# Patient Record
Sex: Female | Born: 1945 | Hispanic: No | Marital: Single | Smoking: Never smoker
Health system: Southern US, Community
[De-identification: ages and names within clinical notes are randomized; demographics above are authoritative.]

## PROBLEM LIST (undated history)

## (undated) DIAGNOSIS — I10 Essential (primary) hypertension: Secondary | ICD-10-CM

## (undated) HISTORY — PX: REPLACEMENT TOTAL KNEE: SUR1224

---

## 2010-11-05 ENCOUNTER — Ambulatory Visit (HOSPITAL_BASED_OUTPATIENT_CLINIC_OR_DEPARTMENT_OTHER): Admission: RE | Admit: 2010-11-05 | Payer: Self-pay | Source: Home / Self Care | Admitting: Orthopedic Surgery

## 2015-09-25 DIAGNOSIS — N3946 Mixed incontinence: Secondary | ICD-10-CM | POA: Diagnosis not present

## 2015-10-02 DIAGNOSIS — N3941 Urge incontinence: Secondary | ICD-10-CM | POA: Diagnosis not present

## 2015-10-09 DIAGNOSIS — R32 Unspecified urinary incontinence: Secondary | ICD-10-CM | POA: Diagnosis not present

## 2015-10-16 DIAGNOSIS — R32 Unspecified urinary incontinence: Secondary | ICD-10-CM | POA: Diagnosis not present

## 2015-10-23 DIAGNOSIS — R32 Unspecified urinary incontinence: Secondary | ICD-10-CM | POA: Diagnosis not present

## 2015-12-09 DIAGNOSIS — E039 Hypothyroidism, unspecified: Secondary | ICD-10-CM | POA: Diagnosis not present

## 2015-12-09 DIAGNOSIS — Z298 Encounter for other specified prophylactic measures: Secondary | ICD-10-CM | POA: Diagnosis not present

## 2015-12-09 DIAGNOSIS — R1319 Other dysphagia: Secondary | ICD-10-CM | POA: Diagnosis not present

## 2015-12-09 DIAGNOSIS — R32 Unspecified urinary incontinence: Secondary | ICD-10-CM | POA: Diagnosis not present

## 2015-12-09 DIAGNOSIS — I10 Essential (primary) hypertension: Secondary | ICD-10-CM | POA: Diagnosis not present

## 2016-01-14 DIAGNOSIS — R599 Enlarged lymph nodes, unspecified: Secondary | ICD-10-CM | POA: Diagnosis not present

## 2016-02-26 DIAGNOSIS — K219 Gastro-esophageal reflux disease without esophagitis: Secondary | ICD-10-CM | POA: Diagnosis not present

## 2016-02-26 DIAGNOSIS — K210 Gastro-esophageal reflux disease with esophagitis: Secondary | ICD-10-CM | POA: Diagnosis not present

## 2016-02-26 DIAGNOSIS — R1310 Dysphagia, unspecified: Secondary | ICD-10-CM | POA: Diagnosis not present

## 2016-02-26 DIAGNOSIS — R1319 Other dysphagia: Secondary | ICD-10-CM | POA: Diagnosis not present

## 2016-03-06 DIAGNOSIS — H5213 Myopia, bilateral: Secondary | ICD-10-CM | POA: Diagnosis not present

## 2016-03-06 DIAGNOSIS — H26491 Other secondary cataract, right eye: Secondary | ICD-10-CM | POA: Diagnosis not present

## 2016-03-06 DIAGNOSIS — R1314 Dysphagia, pharyngoesophageal phase: Secondary | ICD-10-CM | POA: Diagnosis not present

## 2016-03-20 DIAGNOSIS — K209 Esophagitis, unspecified: Secondary | ICD-10-CM | POA: Diagnosis not present

## 2016-03-20 DIAGNOSIS — R1310 Dysphagia, unspecified: Secondary | ICD-10-CM | POA: Diagnosis not present

## 2016-03-20 DIAGNOSIS — K2970 Gastritis, unspecified, without bleeding: Secondary | ICD-10-CM | POA: Diagnosis not present

## 2016-03-20 DIAGNOSIS — K317 Polyp of stomach and duodenum: Secondary | ICD-10-CM | POA: Diagnosis not present

## 2016-03-20 DIAGNOSIS — Z0000 Encounter for general adult medical examination without abnormal findings: Secondary | ICD-10-CM | POA: Diagnosis not present

## 2016-04-07 DIAGNOSIS — R05 Cough: Secondary | ICD-10-CM | POA: Diagnosis not present

## 2016-04-07 DIAGNOSIS — R32 Unspecified urinary incontinence: Secondary | ICD-10-CM | POA: Diagnosis not present

## 2016-04-07 DIAGNOSIS — N3000 Acute cystitis without hematuria: Secondary | ICD-10-CM | POA: Diagnosis not present

## 2016-04-07 DIAGNOSIS — R350 Frequency of micturition: Secondary | ICD-10-CM | POA: Diagnosis not present

## 2016-05-01 DIAGNOSIS — R350 Frequency of micturition: Secondary | ICD-10-CM | POA: Diagnosis not present

## 2016-05-01 DIAGNOSIS — R32 Unspecified urinary incontinence: Secondary | ICD-10-CM | POA: Diagnosis not present

## 2016-05-01 DIAGNOSIS — N3941 Urge incontinence: Secondary | ICD-10-CM | POA: Diagnosis not present

## 2016-05-19 DIAGNOSIS — N3000 Acute cystitis without hematuria: Secondary | ICD-10-CM | POA: Diagnosis not present

## 2016-05-19 DIAGNOSIS — R296 Repeated falls: Secondary | ICD-10-CM | POA: Diagnosis not present

## 2016-05-19 DIAGNOSIS — R413 Other amnesia: Secondary | ICD-10-CM | POA: Diagnosis not present

## 2016-05-26 DIAGNOSIS — Z1231 Encounter for screening mammogram for malignant neoplasm of breast: Secondary | ICD-10-CM | POA: Diagnosis not present

## 2016-06-18 DIAGNOSIS — Z23 Encounter for immunization: Secondary | ICD-10-CM | POA: Diagnosis not present

## 2016-06-18 DIAGNOSIS — N3000 Acute cystitis without hematuria: Secondary | ICD-10-CM | POA: Diagnosis not present

## 2016-06-24 DIAGNOSIS — N3941 Urge incontinence: Secondary | ICD-10-CM | POA: Diagnosis not present

## 2016-06-29 DIAGNOSIS — N3941 Urge incontinence: Secondary | ICD-10-CM | POA: Diagnosis not present

## 2016-06-29 DIAGNOSIS — Z87440 Personal history of urinary (tract) infections: Secondary | ICD-10-CM | POA: Diagnosis not present

## 2016-06-29 DIAGNOSIS — R350 Frequency of micturition: Secondary | ICD-10-CM | POA: Diagnosis not present

## 2016-09-01 DIAGNOSIS — N3941 Urge incontinence: Secondary | ICD-10-CM | POA: Diagnosis not present

## 2016-09-01 DIAGNOSIS — R350 Frequency of micturition: Secondary | ICD-10-CM | POA: Diagnosis not present

## 2016-09-01 DIAGNOSIS — R8290 Unspecified abnormal findings in urine: Secondary | ICD-10-CM | POA: Diagnosis not present

## 2016-09-01 DIAGNOSIS — Z87440 Personal history of urinary (tract) infections: Secondary | ICD-10-CM | POA: Diagnosis not present

## 2016-09-16 DIAGNOSIS — N3941 Urge incontinence: Secondary | ICD-10-CM | POA: Diagnosis not present

## 2016-09-16 DIAGNOSIS — Z87440 Personal history of urinary (tract) infections: Secondary | ICD-10-CM | POA: Diagnosis not present

## 2016-09-29 DIAGNOSIS — N3941 Urge incontinence: Secondary | ICD-10-CM | POA: Diagnosis not present

## 2016-10-16 DIAGNOSIS — Z87440 Personal history of urinary (tract) infections: Secondary | ICD-10-CM | POA: Diagnosis not present

## 2016-10-16 DIAGNOSIS — N3941 Urge incontinence: Secondary | ICD-10-CM | POA: Diagnosis not present

## 2016-10-16 DIAGNOSIS — R8290 Unspecified abnormal findings in urine: Secondary | ICD-10-CM | POA: Diagnosis not present

## 2016-10-27 DIAGNOSIS — Z87440 Personal history of urinary (tract) infections: Secondary | ICD-10-CM | POA: Diagnosis not present

## 2016-10-27 DIAGNOSIS — N3941 Urge incontinence: Secondary | ICD-10-CM | POA: Diagnosis not present

## 2016-11-25 DIAGNOSIS — Z87440 Personal history of urinary (tract) infections: Secondary | ICD-10-CM | POA: Diagnosis not present

## 2016-11-25 DIAGNOSIS — N3941 Urge incontinence: Secondary | ICD-10-CM | POA: Diagnosis not present

## 2016-12-18 DIAGNOSIS — I10 Essential (primary) hypertension: Secondary | ICD-10-CM | POA: Diagnosis not present

## 2016-12-18 DIAGNOSIS — K209 Esophagitis, unspecified: Secondary | ICD-10-CM | POA: Diagnosis not present

## 2016-12-18 DIAGNOSIS — R1319 Other dysphagia: Secondary | ICD-10-CM | POA: Diagnosis not present

## 2016-12-18 DIAGNOSIS — E039 Hypothyroidism, unspecified: Secondary | ICD-10-CM | POA: Diagnosis not present

## 2016-12-18 DIAGNOSIS — Z0000 Encounter for general adult medical examination without abnormal findings: Secondary | ICD-10-CM | POA: Diagnosis not present

## 2016-12-23 DIAGNOSIS — N3941 Urge incontinence: Secondary | ICD-10-CM | POA: Diagnosis not present

## 2016-12-23 DIAGNOSIS — Z87440 Personal history of urinary (tract) infections: Secondary | ICD-10-CM | POA: Diagnosis not present

## 2017-01-06 DIAGNOSIS — N3941 Urge incontinence: Secondary | ICD-10-CM | POA: Diagnosis not present

## 2017-01-06 DIAGNOSIS — R2232 Localized swelling, mass and lump, left upper limb: Secondary | ICD-10-CM | POA: Diagnosis not present

## 2017-01-06 DIAGNOSIS — Z87440 Personal history of urinary (tract) infections: Secondary | ICD-10-CM | POA: Diagnosis not present

## 2017-02-03 DIAGNOSIS — N3941 Urge incontinence: Secondary | ICD-10-CM | POA: Diagnosis not present

## 2017-02-03 DIAGNOSIS — Z87440 Personal history of urinary (tract) infections: Secondary | ICD-10-CM | POA: Diagnosis not present

## 2017-03-23 DIAGNOSIS — Z87440 Personal history of urinary (tract) infections: Secondary | ICD-10-CM | POA: Diagnosis not present

## 2017-03-23 DIAGNOSIS — N3941 Urge incontinence: Secondary | ICD-10-CM | POA: Diagnosis not present

## 2017-05-03 DIAGNOSIS — R1314 Dysphagia, pharyngoesophageal phase: Secondary | ICD-10-CM | POA: Diagnosis not present

## 2017-05-03 DIAGNOSIS — K209 Esophagitis, unspecified: Secondary | ICD-10-CM | POA: Diagnosis not present

## 2017-05-13 DIAGNOSIS — K449 Diaphragmatic hernia without obstruction or gangrene: Secondary | ICD-10-CM | POA: Diagnosis not present

## 2017-05-13 DIAGNOSIS — K317 Polyp of stomach and duodenum: Secondary | ICD-10-CM | POA: Diagnosis not present

## 2017-05-13 DIAGNOSIS — R1310 Dysphagia, unspecified: Secondary | ICD-10-CM | POA: Diagnosis not present

## 2017-05-13 DIAGNOSIS — K222 Esophageal obstruction: Secondary | ICD-10-CM | POA: Diagnosis not present

## 2017-05-26 DIAGNOSIS — K209 Esophagitis, unspecified: Secondary | ICD-10-CM | POA: Diagnosis not present

## 2017-05-26 DIAGNOSIS — R1319 Other dysphagia: Secondary | ICD-10-CM | POA: Diagnosis not present

## 2017-06-11 DIAGNOSIS — Z23 Encounter for immunization: Secondary | ICD-10-CM | POA: Diagnosis not present

## 2017-06-22 DIAGNOSIS — Z1231 Encounter for screening mammogram for malignant neoplasm of breast: Secondary | ICD-10-CM | POA: Diagnosis not present

## 2017-08-03 DIAGNOSIS — R1310 Dysphagia, unspecified: Secondary | ICD-10-CM | POA: Diagnosis not present

## 2017-11-26 DIAGNOSIS — K118 Other diseases of salivary glands: Secondary | ICD-10-CM | POA: Diagnosis not present

## 2018-01-07 DIAGNOSIS — M545 Low back pain: Secondary | ICD-10-CM | POA: Diagnosis not present

## 2018-01-07 DIAGNOSIS — R350 Frequency of micturition: Secondary | ICD-10-CM | POA: Diagnosis not present

## 2018-01-07 DIAGNOSIS — R413 Other amnesia: Secondary | ICD-10-CM | POA: Diagnosis not present

## 2018-01-07 DIAGNOSIS — I10 Essential (primary) hypertension: Secondary | ICD-10-CM | POA: Diagnosis not present

## 2018-01-07 DIAGNOSIS — R296 Repeated falls: Secondary | ICD-10-CM | POA: Diagnosis not present

## 2018-01-07 DIAGNOSIS — Z1159 Encounter for screening for other viral diseases: Secondary | ICD-10-CM | POA: Diagnosis not present

## 2018-01-07 DIAGNOSIS — K921 Melena: Secondary | ICD-10-CM | POA: Diagnosis not present

## 2018-01-07 DIAGNOSIS — Z23 Encounter for immunization: Secondary | ICD-10-CM | POA: Diagnosis not present

## 2018-01-07 DIAGNOSIS — E039 Hypothyroidism, unspecified: Secondary | ICD-10-CM | POA: Diagnosis not present

## 2018-01-07 DIAGNOSIS — Z0000 Encounter for general adult medical examination without abnormal findings: Secondary | ICD-10-CM | POA: Diagnosis not present

## 2018-01-19 DIAGNOSIS — Z780 Asymptomatic menopausal state: Secondary | ICD-10-CM | POA: Diagnosis not present

## 2018-01-19 DIAGNOSIS — M810 Age-related osteoporosis without current pathological fracture: Secondary | ICD-10-CM | POA: Diagnosis not present

## 2018-01-19 DIAGNOSIS — M8589 Other specified disorders of bone density and structure, multiple sites: Secondary | ICD-10-CM | POA: Diagnosis not present

## 2018-01-26 DIAGNOSIS — N3941 Urge incontinence: Secondary | ICD-10-CM | POA: Diagnosis not present

## 2018-01-26 DIAGNOSIS — Z87440 Personal history of urinary (tract) infections: Secondary | ICD-10-CM | POA: Diagnosis not present

## 2018-01-26 DIAGNOSIS — R8290 Unspecified abnormal findings in urine: Secondary | ICD-10-CM | POA: Diagnosis not present

## 2018-02-02 DIAGNOSIS — M7061 Trochanteric bursitis, right hip: Secondary | ICD-10-CM | POA: Diagnosis not present

## 2018-02-02 DIAGNOSIS — R29898 Other symptoms and signs involving the musculoskeletal system: Secondary | ICD-10-CM | POA: Diagnosis not present

## 2018-02-02 DIAGNOSIS — M25561 Pain in right knee: Secondary | ICD-10-CM | POA: Diagnosis not present

## 2018-02-02 DIAGNOSIS — M545 Low back pain: Secondary | ICD-10-CM | POA: Diagnosis not present

## 2018-02-03 DIAGNOSIS — M1711 Unilateral primary osteoarthritis, right knee: Secondary | ICD-10-CM | POA: Diagnosis not present

## 2018-02-17 DIAGNOSIS — N390 Urinary tract infection, site not specified: Secondary | ICD-10-CM | POA: Diagnosis not present

## 2018-02-17 DIAGNOSIS — R8290 Unspecified abnormal findings in urine: Secondary | ICD-10-CM | POA: Diagnosis not present

## 2018-02-17 DIAGNOSIS — R300 Dysuria: Secondary | ICD-10-CM | POA: Diagnosis not present

## 2018-03-02 DIAGNOSIS — S20411A Abrasion of right back wall of thorax, initial encounter: Secondary | ICD-10-CM | POA: Diagnosis not present

## 2018-03-02 DIAGNOSIS — B078 Other viral warts: Secondary | ICD-10-CM | POA: Diagnosis not present

## 2018-03-07 DIAGNOSIS — S52571A Other intraarticular fracture of lower end of right radius, initial encounter for closed fracture: Secondary | ICD-10-CM | POA: Diagnosis not present

## 2018-03-07 DIAGNOSIS — S62024A Nondisplaced fracture of middle third of navicular [scaphoid] bone of right wrist, initial encounter for closed fracture: Secondary | ICD-10-CM | POA: Diagnosis not present

## 2018-03-07 DIAGNOSIS — S6991XA Unspecified injury of right wrist, hand and finger(s), initial encounter: Secondary | ICD-10-CM | POA: Diagnosis not present

## 2018-03-15 DIAGNOSIS — S62024A Nondisplaced fracture of middle third of navicular [scaphoid] bone of right wrist, initial encounter for closed fracture: Secondary | ICD-10-CM | POA: Diagnosis not present

## 2018-03-22 DIAGNOSIS — S62024D Nondisplaced fracture of middle third of navicular [scaphoid] bone of right wrist, subsequent encounter for fracture with routine healing: Secondary | ICD-10-CM | POA: Diagnosis not present

## 2018-03-22 DIAGNOSIS — S52571D Other intraarticular fracture of lower end of right radius, subsequent encounter for closed fracture with routine healing: Secondary | ICD-10-CM | POA: Diagnosis not present

## 2018-03-29 DIAGNOSIS — S52124D Nondisplaced fracture of head of right radius, subsequent encounter for closed fracture with routine healing: Secondary | ICD-10-CM | POA: Diagnosis not present

## 2018-03-29 DIAGNOSIS — S52571A Other intraarticular fracture of lower end of right radius, initial encounter for closed fracture: Secondary | ICD-10-CM | POA: Diagnosis not present

## 2018-03-29 DIAGNOSIS — M19031 Primary osteoarthritis, right wrist: Secondary | ICD-10-CM | POA: Diagnosis not present

## 2018-03-29 DIAGNOSIS — S52571D Other intraarticular fracture of lower end of right radius, subsequent encounter for closed fracture with routine healing: Secondary | ICD-10-CM | POA: Diagnosis not present

## 2018-04-04 DIAGNOSIS — N3941 Urge incontinence: Secondary | ICD-10-CM | POA: Diagnosis not present

## 2018-04-04 DIAGNOSIS — Z87440 Personal history of urinary (tract) infections: Secondary | ICD-10-CM | POA: Diagnosis not present

## 2018-04-22 DIAGNOSIS — N3941 Urge incontinence: Secondary | ICD-10-CM | POA: Diagnosis not present

## 2018-04-26 DIAGNOSIS — S52571D Other intraarticular fracture of lower end of right radius, subsequent encounter for closed fracture with routine healing: Secondary | ICD-10-CM | POA: Diagnosis not present

## 2018-04-26 DIAGNOSIS — S52571A Other intraarticular fracture of lower end of right radius, initial encounter for closed fracture: Secondary | ICD-10-CM | POA: Diagnosis not present

## 2018-07-28 DIAGNOSIS — N3941 Urge incontinence: Secondary | ICD-10-CM | POA: Diagnosis not present

## 2018-07-28 DIAGNOSIS — N390 Urinary tract infection, site not specified: Secondary | ICD-10-CM | POA: Diagnosis not present

## 2018-08-23 DIAGNOSIS — R8290 Unspecified abnormal findings in urine: Secondary | ICD-10-CM | POA: Diagnosis not present

## 2018-08-23 DIAGNOSIS — N390 Urinary tract infection, site not specified: Secondary | ICD-10-CM | POA: Diagnosis not present

## 2018-08-23 DIAGNOSIS — N3941 Urge incontinence: Secondary | ICD-10-CM | POA: Diagnosis not present

## 2018-09-20 DIAGNOSIS — R195 Other fecal abnormalities: Secondary | ICD-10-CM | POA: Diagnosis not present

## 2018-09-20 DIAGNOSIS — R1319 Other dysphagia: Secondary | ICD-10-CM | POA: Diagnosis not present

## 2018-09-20 DIAGNOSIS — R1012 Left upper quadrant pain: Secondary | ICD-10-CM | POA: Diagnosis not present

## 2018-09-21 DIAGNOSIS — R1012 Left upper quadrant pain: Secondary | ICD-10-CM | POA: Diagnosis not present

## 2018-09-21 DIAGNOSIS — N281 Cyst of kidney, acquired: Secondary | ICD-10-CM | POA: Diagnosis not present

## 2018-09-21 DIAGNOSIS — K7689 Other specified diseases of liver: Secondary | ICD-10-CM | POA: Diagnosis not present

## 2018-09-21 DIAGNOSIS — R195 Other fecal abnormalities: Secondary | ICD-10-CM | POA: Diagnosis not present

## 2018-09-26 DIAGNOSIS — R1013 Epigastric pain: Secondary | ICD-10-CM | POA: Diagnosis not present

## 2018-09-26 DIAGNOSIS — R1319 Other dysphagia: Secondary | ICD-10-CM | POA: Diagnosis not present

## 2018-09-28 DIAGNOSIS — R1012 Left upper quadrant pain: Secondary | ICD-10-CM | POA: Diagnosis not present

## 2018-09-28 DIAGNOSIS — Z8719 Personal history of other diseases of the digestive system: Secondary | ICD-10-CM | POA: Diagnosis not present

## 2018-09-28 DIAGNOSIS — R1310 Dysphagia, unspecified: Secondary | ICD-10-CM | POA: Diagnosis not present

## 2018-10-24 DIAGNOSIS — K222 Esophageal obstruction: Secondary | ICD-10-CM | POA: Diagnosis not present

## 2018-10-24 DIAGNOSIS — R1012 Left upper quadrant pain: Secondary | ICD-10-CM | POA: Diagnosis not present

## 2018-10-24 DIAGNOSIS — K209 Esophagitis, unspecified: Secondary | ICD-10-CM | POA: Diagnosis not present

## 2018-10-24 DIAGNOSIS — K449 Diaphragmatic hernia without obstruction or gangrene: Secondary | ICD-10-CM | POA: Diagnosis not present

## 2018-10-24 DIAGNOSIS — K2930 Chronic superficial gastritis without bleeding: Secondary | ICD-10-CM | POA: Diagnosis not present

## 2018-10-24 DIAGNOSIS — K317 Polyp of stomach and duodenum: Secondary | ICD-10-CM | POA: Diagnosis not present

## 2018-11-01 DIAGNOSIS — Z1231 Encounter for screening mammogram for malignant neoplasm of breast: Secondary | ICD-10-CM | POA: Diagnosis not present

## 2018-11-03 DIAGNOSIS — N281 Cyst of kidney, acquired: Secondary | ICD-10-CM | POA: Diagnosis not present

## 2018-11-03 DIAGNOSIS — R1012 Left upper quadrant pain: Secondary | ICD-10-CM | POA: Diagnosis not present

## 2018-11-23 DIAGNOSIS — N3941 Urge incontinence: Secondary | ICD-10-CM | POA: Diagnosis not present

## 2018-11-23 DIAGNOSIS — Z87440 Personal history of urinary (tract) infections: Secondary | ICD-10-CM | POA: Diagnosis not present

## 2019-01-10 DIAGNOSIS — S20212A Contusion of left front wall of thorax, initial encounter: Secondary | ICD-10-CM | POA: Diagnosis not present

## 2019-01-10 DIAGNOSIS — R0781 Pleurodynia: Secondary | ICD-10-CM | POA: Diagnosis not present

## 2019-01-12 DIAGNOSIS — M1711 Unilateral primary osteoarthritis, right knee: Secondary | ICD-10-CM | POA: Diagnosis not present

## 2019-01-12 DIAGNOSIS — M25561 Pain in right knee: Secondary | ICD-10-CM | POA: Diagnosis not present

## 2019-03-08 DIAGNOSIS — Z87440 Personal history of urinary (tract) infections: Secondary | ICD-10-CM | POA: Diagnosis not present

## 2019-03-08 DIAGNOSIS — N3941 Urge incontinence: Secondary | ICD-10-CM | POA: Diagnosis not present

## 2019-03-15 DIAGNOSIS — M1711 Unilateral primary osteoarthritis, right knee: Secondary | ICD-10-CM | POA: Diagnosis not present

## 2019-03-25 DIAGNOSIS — M25561 Pain in right knee: Secondary | ICD-10-CM | POA: Diagnosis not present

## 2019-03-25 DIAGNOSIS — M7121 Synovial cyst of popliteal space [Baker], right knee: Secondary | ICD-10-CM | POA: Diagnosis not present

## 2019-03-25 DIAGNOSIS — M25461 Effusion, right knee: Secondary | ICD-10-CM | POA: Diagnosis not present

## 2019-03-30 DIAGNOSIS — M1711 Unilateral primary osteoarthritis, right knee: Secondary | ICD-10-CM | POA: Diagnosis not present

## 2019-04-24 DIAGNOSIS — I10 Essential (primary) hypertension: Secondary | ICD-10-CM | POA: Diagnosis not present

## 2019-04-24 DIAGNOSIS — D649 Anemia, unspecified: Secondary | ICD-10-CM | POA: Diagnosis not present

## 2019-04-27 DIAGNOSIS — E039 Hypothyroidism, unspecified: Secondary | ICD-10-CM | POA: Diagnosis not present

## 2019-04-27 DIAGNOSIS — M8589 Other specified disorders of bone density and structure, multiple sites: Secondary | ICD-10-CM | POA: Diagnosis not present

## 2019-04-27 DIAGNOSIS — I10 Essential (primary) hypertension: Secondary | ICD-10-CM | POA: Diagnosis not present

## 2019-04-27 DIAGNOSIS — M545 Low back pain: Secondary | ICD-10-CM | POA: Diagnosis not present

## 2019-04-27 DIAGNOSIS — D649 Anemia, unspecified: Secondary | ICD-10-CM | POA: Diagnosis not present

## 2019-04-27 DIAGNOSIS — Z0000 Encounter for general adult medical examination without abnormal findings: Secondary | ICD-10-CM | POA: Diagnosis not present

## 2019-05-25 DIAGNOSIS — M1711 Unilateral primary osteoarthritis, right knee: Secondary | ICD-10-CM | POA: Diagnosis not present

## 2019-06-02 DIAGNOSIS — N3941 Urge incontinence: Secondary | ICD-10-CM | POA: Diagnosis not present

## 2019-06-02 DIAGNOSIS — Z87440 Personal history of urinary (tract) infections: Secondary | ICD-10-CM | POA: Diagnosis not present

## 2019-06-02 DIAGNOSIS — Z79899 Other long term (current) drug therapy: Secondary | ICD-10-CM | POA: Diagnosis not present

## 2019-06-02 DIAGNOSIS — R351 Nocturia: Secondary | ICD-10-CM | POA: Diagnosis not present

## 2019-06-08 DIAGNOSIS — M1711 Unilateral primary osteoarthritis, right knee: Secondary | ICD-10-CM | POA: Diagnosis not present

## 2019-06-12 DIAGNOSIS — Z01810 Encounter for preprocedural cardiovascular examination: Secondary | ICD-10-CM | POA: Diagnosis not present

## 2019-06-12 DIAGNOSIS — R001 Bradycardia, unspecified: Secondary | ICD-10-CM | POA: Diagnosis not present

## 2019-06-12 DIAGNOSIS — M1711 Unilateral primary osteoarthritis, right knee: Secondary | ICD-10-CM | POA: Diagnosis not present

## 2019-06-15 DIAGNOSIS — Z20828 Contact with and (suspected) exposure to other viral communicable diseases: Secondary | ICD-10-CM | POA: Diagnosis not present

## 2019-06-15 DIAGNOSIS — Z01812 Encounter for preprocedural laboratory examination: Secondary | ICD-10-CM | POA: Diagnosis not present

## 2019-06-15 DIAGNOSIS — M1711 Unilateral primary osteoarthritis, right knee: Secondary | ICD-10-CM | POA: Diagnosis not present

## 2019-06-20 DIAGNOSIS — E039 Hypothyroidism, unspecified: Secondary | ICD-10-CM | POA: Diagnosis not present

## 2019-06-20 DIAGNOSIS — Z90710 Acquired absence of both cervix and uterus: Secondary | ICD-10-CM | POA: Diagnosis not present

## 2019-06-20 DIAGNOSIS — E785 Hyperlipidemia, unspecified: Secondary | ICD-10-CM | POA: Diagnosis not present

## 2019-06-20 DIAGNOSIS — Z96651 Presence of right artificial knee joint: Secondary | ICD-10-CM | POA: Diagnosis not present

## 2019-06-20 DIAGNOSIS — Z79890 Hormone replacement therapy (postmenopausal): Secondary | ICD-10-CM | POA: Diagnosis not present

## 2019-06-20 DIAGNOSIS — Z471 Aftercare following joint replacement surgery: Secondary | ICD-10-CM | POA: Diagnosis not present

## 2019-06-20 DIAGNOSIS — I10 Essential (primary) hypertension: Secondary | ICD-10-CM | POA: Diagnosis not present

## 2019-06-20 DIAGNOSIS — M1711 Unilateral primary osteoarthritis, right knee: Secondary | ICD-10-CM | POA: Diagnosis not present

## 2019-06-24 DIAGNOSIS — M545 Low back pain: Secondary | ICD-10-CM | POA: Diagnosis not present

## 2019-06-24 DIAGNOSIS — R1310 Dysphagia, unspecified: Secondary | ICD-10-CM | POA: Diagnosis not present

## 2019-06-24 DIAGNOSIS — E039 Hypothyroidism, unspecified: Secondary | ICD-10-CM | POA: Diagnosis not present

## 2019-06-24 DIAGNOSIS — Z7982 Long term (current) use of aspirin: Secondary | ICD-10-CM | POA: Diagnosis not present

## 2019-06-24 DIAGNOSIS — Z9181 History of falling: Secondary | ICD-10-CM | POA: Diagnosis not present

## 2019-06-24 DIAGNOSIS — K219 Gastro-esophageal reflux disease without esophagitis: Secondary | ICD-10-CM | POA: Diagnosis not present

## 2019-06-24 DIAGNOSIS — Z96651 Presence of right artificial knee joint: Secondary | ICD-10-CM | POA: Diagnosis not present

## 2019-06-24 DIAGNOSIS — Z471 Aftercare following joint replacement surgery: Secondary | ICD-10-CM | POA: Diagnosis not present

## 2019-06-24 DIAGNOSIS — I10 Essential (primary) hypertension: Secondary | ICD-10-CM | POA: Diagnosis not present

## 2019-06-24 DIAGNOSIS — E785 Hyperlipidemia, unspecified: Secondary | ICD-10-CM | POA: Diagnosis not present

## 2019-06-24 DIAGNOSIS — H9190 Unspecified hearing loss, unspecified ear: Secondary | ICD-10-CM | POA: Diagnosis not present

## 2019-07-06 DIAGNOSIS — K219 Gastro-esophageal reflux disease without esophagitis: Secondary | ICD-10-CM | POA: Diagnosis not present

## 2019-07-06 DIAGNOSIS — Z9181 History of falling: Secondary | ICD-10-CM | POA: Diagnosis not present

## 2019-07-06 DIAGNOSIS — R1310 Dysphagia, unspecified: Secondary | ICD-10-CM | POA: Diagnosis not present

## 2019-07-06 DIAGNOSIS — M545 Low back pain: Secondary | ICD-10-CM | POA: Diagnosis not present

## 2019-07-06 DIAGNOSIS — E039 Hypothyroidism, unspecified: Secondary | ICD-10-CM | POA: Diagnosis not present

## 2019-07-06 DIAGNOSIS — Z471 Aftercare following joint replacement surgery: Secondary | ICD-10-CM | POA: Diagnosis not present

## 2019-07-06 DIAGNOSIS — Z7982 Long term (current) use of aspirin: Secondary | ICD-10-CM | POA: Diagnosis not present

## 2019-07-06 DIAGNOSIS — E785 Hyperlipidemia, unspecified: Secondary | ICD-10-CM | POA: Diagnosis not present

## 2019-07-06 DIAGNOSIS — H9190 Unspecified hearing loss, unspecified ear: Secondary | ICD-10-CM | POA: Diagnosis not present

## 2019-07-06 DIAGNOSIS — Z96651 Presence of right artificial knee joint: Secondary | ICD-10-CM | POA: Diagnosis not present

## 2019-07-06 DIAGNOSIS — I10 Essential (primary) hypertension: Secondary | ICD-10-CM | POA: Diagnosis not present

## 2019-07-10 DIAGNOSIS — Z96651 Presence of right artificial knee joint: Secondary | ICD-10-CM | POA: Diagnosis not present

## 2019-07-10 DIAGNOSIS — Z471 Aftercare following joint replacement surgery: Secondary | ICD-10-CM | POA: Diagnosis not present

## 2019-07-14 DIAGNOSIS — Z96651 Presence of right artificial knee joint: Secondary | ICD-10-CM | POA: Diagnosis not present

## 2019-07-14 DIAGNOSIS — Z471 Aftercare following joint replacement surgery: Secondary | ICD-10-CM | POA: Diagnosis not present

## 2019-07-17 DIAGNOSIS — Z96651 Presence of right artificial knee joint: Secondary | ICD-10-CM | POA: Diagnosis not present

## 2019-07-17 DIAGNOSIS — Z471 Aftercare following joint replacement surgery: Secondary | ICD-10-CM | POA: Diagnosis not present

## 2019-07-21 DIAGNOSIS — Z96651 Presence of right artificial knee joint: Secondary | ICD-10-CM | POA: Diagnosis not present

## 2019-07-21 DIAGNOSIS — Z471 Aftercare following joint replacement surgery: Secondary | ICD-10-CM | POA: Diagnosis not present

## 2019-07-25 DIAGNOSIS — Z471 Aftercare following joint replacement surgery: Secondary | ICD-10-CM | POA: Diagnosis not present

## 2019-07-25 DIAGNOSIS — Z96651 Presence of right artificial knee joint: Secondary | ICD-10-CM | POA: Diagnosis not present

## 2019-07-28 DIAGNOSIS — Z471 Aftercare following joint replacement surgery: Secondary | ICD-10-CM | POA: Diagnosis not present

## 2019-07-28 DIAGNOSIS — Z96652 Presence of left artificial knee joint: Secondary | ICD-10-CM | POA: Diagnosis not present

## 2019-08-02 DIAGNOSIS — Z96652 Presence of left artificial knee joint: Secondary | ICD-10-CM | POA: Diagnosis not present

## 2019-08-02 DIAGNOSIS — Z471 Aftercare following joint replacement surgery: Secondary | ICD-10-CM | POA: Diagnosis not present

## 2019-08-04 DIAGNOSIS — Z96651 Presence of right artificial knee joint: Secondary | ICD-10-CM | POA: Diagnosis not present

## 2019-08-04 DIAGNOSIS — Z471 Aftercare following joint replacement surgery: Secondary | ICD-10-CM | POA: Diagnosis not present

## 2019-08-07 DIAGNOSIS — Z471 Aftercare following joint replacement surgery: Secondary | ICD-10-CM | POA: Diagnosis not present

## 2019-08-07 DIAGNOSIS — Z96652 Presence of left artificial knee joint: Secondary | ICD-10-CM | POA: Diagnosis not present

## 2019-08-14 DIAGNOSIS — Z471 Aftercare following joint replacement surgery: Secondary | ICD-10-CM | POA: Diagnosis not present

## 2019-08-14 DIAGNOSIS — Z96652 Presence of left artificial knee joint: Secondary | ICD-10-CM | POA: Diagnosis not present

## 2019-08-16 DIAGNOSIS — Z471 Aftercare following joint replacement surgery: Secondary | ICD-10-CM | POA: Diagnosis not present

## 2019-08-16 DIAGNOSIS — Z96641 Presence of right artificial hip joint: Secondary | ICD-10-CM | POA: Diagnosis not present

## 2019-08-28 DIAGNOSIS — R221 Localized swelling, mass and lump, neck: Secondary | ICD-10-CM | POA: Diagnosis not present

## 2019-09-21 DIAGNOSIS — M1711 Unilateral primary osteoarthritis, right knee: Secondary | ICD-10-CM | POA: Diagnosis not present

## 2019-10-11 DIAGNOSIS — R5383 Other fatigue: Secondary | ICD-10-CM | POA: Diagnosis not present

## 2019-10-12 DIAGNOSIS — R5383 Other fatigue: Secondary | ICD-10-CM | POA: Diagnosis not present

## 2019-10-19 DIAGNOSIS — I10 Essential (primary) hypertension: Secondary | ICD-10-CM | POA: Diagnosis not present

## 2019-11-06 DIAGNOSIS — Z1231 Encounter for screening mammogram for malignant neoplasm of breast: Secondary | ICD-10-CM | POA: Diagnosis not present

## 2019-11-20 DIAGNOSIS — M25561 Pain in right knee: Secondary | ICD-10-CM | POA: Diagnosis not present

## 2020-01-01 DIAGNOSIS — R8290 Unspecified abnormal findings in urine: Secondary | ICD-10-CM | POA: Diagnosis not present

## 2020-01-01 DIAGNOSIS — N3941 Urge incontinence: Secondary | ICD-10-CM | POA: Diagnosis not present

## 2020-01-01 DIAGNOSIS — Z87440 Personal history of urinary (tract) infections: Secondary | ICD-10-CM | POA: Diagnosis not present

## 2020-01-02 DIAGNOSIS — R490 Dysphonia: Secondary | ICD-10-CM | POA: Diagnosis not present

## 2020-01-02 DIAGNOSIS — R1319 Other dysphagia: Secondary | ICD-10-CM | POA: Diagnosis not present

## 2020-01-02 DIAGNOSIS — Z1211 Encounter for screening for malignant neoplasm of colon: Secondary | ICD-10-CM | POA: Diagnosis not present

## 2020-01-02 DIAGNOSIS — R079 Chest pain, unspecified: Secondary | ICD-10-CM | POA: Diagnosis not present

## 2020-01-02 DIAGNOSIS — D649 Anemia, unspecified: Secondary | ICD-10-CM | POA: Diagnosis not present

## 2020-01-02 DIAGNOSIS — E039 Hypothyroidism, unspecified: Secondary | ICD-10-CM | POA: Diagnosis not present

## 2020-01-02 DIAGNOSIS — M8589 Other specified disorders of bone density and structure, multiple sites: Secondary | ICD-10-CM | POA: Diagnosis not present

## 2020-01-02 DIAGNOSIS — I10 Essential (primary) hypertension: Secondary | ICD-10-CM | POA: Diagnosis not present

## 2020-01-05 DIAGNOSIS — R001 Bradycardia, unspecified: Secondary | ICD-10-CM | POA: Diagnosis not present

## 2020-01-16 DIAGNOSIS — E039 Hypothyroidism, unspecified: Secondary | ICD-10-CM | POA: Diagnosis not present

## 2020-01-16 DIAGNOSIS — S299XXA Unspecified injury of thorax, initial encounter: Secondary | ICD-10-CM | POA: Diagnosis not present

## 2020-01-16 DIAGNOSIS — R0781 Pleurodynia: Secondary | ICD-10-CM | POA: Diagnosis not present

## 2020-01-16 DIAGNOSIS — R1319 Other dysphagia: Secondary | ICD-10-CM | POA: Diagnosis not present

## 2020-01-16 DIAGNOSIS — R079 Chest pain, unspecified: Secondary | ICD-10-CM | POA: Diagnosis not present

## 2020-01-16 DIAGNOSIS — I10 Essential (primary) hypertension: Secondary | ICD-10-CM | POA: Diagnosis not present

## 2020-01-16 DIAGNOSIS — D649 Anemia, unspecified: Secondary | ICD-10-CM | POA: Diagnosis not present

## 2020-01-16 DIAGNOSIS — M8589 Other specified disorders of bone density and structure, multiple sites: Secondary | ICD-10-CM | POA: Diagnosis not present

## 2020-01-16 DIAGNOSIS — R0789 Other chest pain: Secondary | ICD-10-CM | POA: Diagnosis not present

## 2020-01-22 DIAGNOSIS — Z87440 Personal history of urinary (tract) infections: Secondary | ICD-10-CM | POA: Diagnosis not present

## 2020-01-22 DIAGNOSIS — N3941 Urge incontinence: Secondary | ICD-10-CM | POA: Diagnosis not present

## 2020-02-14 DIAGNOSIS — I10 Essential (primary) hypertension: Secondary | ICD-10-CM | POA: Diagnosis not present

## 2020-02-14 DIAGNOSIS — K219 Gastro-esophageal reflux disease without esophagitis: Secondary | ICD-10-CM | POA: Diagnosis not present

## 2020-02-14 DIAGNOSIS — M8589 Other specified disorders of bone density and structure, multiple sites: Secondary | ICD-10-CM | POA: Diagnosis not present

## 2020-02-14 DIAGNOSIS — R0789 Other chest pain: Secondary | ICD-10-CM | POA: Diagnosis not present

## 2020-03-26 DIAGNOSIS — Z87440 Personal history of urinary (tract) infections: Secondary | ICD-10-CM | POA: Diagnosis not present

## 2020-03-26 DIAGNOSIS — N3941 Urge incontinence: Secondary | ICD-10-CM | POA: Diagnosis not present

## 2020-04-05 DIAGNOSIS — M8589 Other specified disorders of bone density and structure, multiple sites: Secondary | ICD-10-CM | POA: Diagnosis not present

## 2020-04-05 DIAGNOSIS — D649 Anemia, unspecified: Secondary | ICD-10-CM | POA: Diagnosis not present

## 2020-04-05 DIAGNOSIS — I129 Hypertensive chronic kidney disease with stage 1 through stage 4 chronic kidney disease, or unspecified chronic kidney disease: Secondary | ICD-10-CM | POA: Diagnosis not present

## 2020-04-05 DIAGNOSIS — I10 Essential (primary) hypertension: Secondary | ICD-10-CM | POA: Diagnosis not present

## 2020-04-05 DIAGNOSIS — E039 Hypothyroidism, unspecified: Secondary | ICD-10-CM | POA: Diagnosis not present

## 2020-04-05 DIAGNOSIS — N1831 Chronic kidney disease, stage 3a: Secondary | ICD-10-CM | POA: Diagnosis not present

## 2020-04-05 DIAGNOSIS — E785 Hyperlipidemia, unspecified: Secondary | ICD-10-CM | POA: Diagnosis not present

## 2020-04-17 DIAGNOSIS — K219 Gastro-esophageal reflux disease without esophagitis: Secondary | ICD-10-CM | POA: Diagnosis not present

## 2020-04-17 DIAGNOSIS — R194 Change in bowel habit: Secondary | ICD-10-CM | POA: Diagnosis not present

## 2020-04-17 DIAGNOSIS — Z8719 Personal history of other diseases of the digestive system: Secondary | ICD-10-CM | POA: Diagnosis not present

## 2020-04-17 DIAGNOSIS — Z1211 Encounter for screening for malignant neoplasm of colon: Secondary | ICD-10-CM | POA: Diagnosis not present

## 2020-04-17 DIAGNOSIS — R1310 Dysphagia, unspecified: Secondary | ICD-10-CM | POA: Diagnosis not present

## 2020-04-17 DIAGNOSIS — R1902 Left upper quadrant abdominal swelling, mass and lump: Secondary | ICD-10-CM | POA: Diagnosis not present

## 2020-04-25 DIAGNOSIS — R1902 Left upper quadrant abdominal swelling, mass and lump: Secondary | ICD-10-CM | POA: Diagnosis not present

## 2020-06-10 DIAGNOSIS — K529 Noninfective gastroenteritis and colitis, unspecified: Secondary | ICD-10-CM | POA: Diagnosis not present

## 2020-06-10 DIAGNOSIS — K648 Other hemorrhoids: Secondary | ICD-10-CM | POA: Diagnosis not present

## 2020-06-10 DIAGNOSIS — K2970 Gastritis, unspecified, without bleeding: Secondary | ICD-10-CM | POA: Diagnosis not present

## 2020-06-10 DIAGNOSIS — K317 Polyp of stomach and duodenum: Secondary | ICD-10-CM | POA: Diagnosis not present

## 2020-06-10 DIAGNOSIS — K2900 Acute gastritis without bleeding: Secondary | ICD-10-CM | POA: Diagnosis not present

## 2020-06-10 DIAGNOSIS — R194 Change in bowel habit: Secondary | ICD-10-CM | POA: Diagnosis not present

## 2020-06-10 DIAGNOSIS — K219 Gastro-esophageal reflux disease without esophagitis: Secondary | ICD-10-CM | POA: Diagnosis not present

## 2020-06-10 DIAGNOSIS — R1310 Dysphagia, unspecified: Secondary | ICD-10-CM | POA: Diagnosis not present

## 2020-06-10 DIAGNOSIS — Z1211 Encounter for screening for malignant neoplasm of colon: Secondary | ICD-10-CM | POA: Diagnosis not present

## 2020-06-10 DIAGNOSIS — K5730 Diverticulosis of large intestine without perforation or abscess without bleeding: Secondary | ICD-10-CM | POA: Diagnosis not present

## 2020-06-10 DIAGNOSIS — K2980 Duodenitis without bleeding: Secondary | ICD-10-CM | POA: Diagnosis not present

## 2020-06-10 DIAGNOSIS — K6289 Other specified diseases of anus and rectum: Secondary | ICD-10-CM | POA: Diagnosis not present

## 2020-06-10 DIAGNOSIS — R1319 Other dysphagia: Secondary | ICD-10-CM | POA: Diagnosis not present

## 2020-07-05 DIAGNOSIS — E785 Hyperlipidemia, unspecified: Secondary | ICD-10-CM | POA: Diagnosis not present

## 2020-07-05 DIAGNOSIS — D649 Anemia, unspecified: Secondary | ICD-10-CM | POA: Diagnosis not present

## 2020-07-05 DIAGNOSIS — Z23 Encounter for immunization: Secondary | ICD-10-CM | POA: Diagnosis not present

## 2020-07-05 DIAGNOSIS — M8589 Other specified disorders of bone density and structure, multiple sites: Secondary | ICD-10-CM | POA: Diagnosis not present

## 2020-07-05 DIAGNOSIS — I129 Hypertensive chronic kidney disease with stage 1 through stage 4 chronic kidney disease, or unspecified chronic kidney disease: Secondary | ICD-10-CM | POA: Diagnosis not present

## 2020-07-05 DIAGNOSIS — N1831 Chronic kidney disease, stage 3a: Secondary | ICD-10-CM | POA: Diagnosis not present

## 2020-07-05 DIAGNOSIS — E039 Hypothyroidism, unspecified: Secondary | ICD-10-CM | POA: Diagnosis not present

## 2020-07-17 DIAGNOSIS — I10 Essential (primary) hypertension: Secondary | ICD-10-CM | POA: Diagnosis not present

## 2020-08-20 DIAGNOSIS — I129 Hypertensive chronic kidney disease with stage 1 through stage 4 chronic kidney disease, or unspecified chronic kidney disease: Secondary | ICD-10-CM | POA: Diagnosis not present

## 2020-08-20 DIAGNOSIS — N1831 Chronic kidney disease, stage 3a: Secondary | ICD-10-CM | POA: Diagnosis not present

## 2020-08-20 DIAGNOSIS — E039 Hypothyroidism, unspecified: Secondary | ICD-10-CM | POA: Diagnosis not present

## 2020-08-20 DIAGNOSIS — E785 Hyperlipidemia, unspecified: Secondary | ICD-10-CM | POA: Diagnosis not present

## 2020-08-20 DIAGNOSIS — M8589 Other specified disorders of bone density and structure, multiple sites: Secondary | ICD-10-CM | POA: Diagnosis not present

## 2020-08-20 DIAGNOSIS — D649 Anemia, unspecified: Secondary | ICD-10-CM | POA: Diagnosis not present

## 2020-08-20 DIAGNOSIS — R079 Chest pain, unspecified: Secondary | ICD-10-CM | POA: Diagnosis not present

## 2020-08-22 DIAGNOSIS — I10 Essential (primary) hypertension: Secondary | ICD-10-CM | POA: Diagnosis not present

## 2020-08-26 DIAGNOSIS — K219 Gastro-esophageal reflux disease without esophagitis: Secondary | ICD-10-CM | POA: Diagnosis not present

## 2020-08-26 DIAGNOSIS — R194 Change in bowel habit: Secondary | ICD-10-CM | POA: Diagnosis not present

## 2020-08-26 DIAGNOSIS — R1314 Dysphagia, pharyngoesophageal phase: Secondary | ICD-10-CM | POA: Diagnosis not present

## 2020-09-03 DIAGNOSIS — M8589 Other specified disorders of bone density and structure, multiple sites: Secondary | ICD-10-CM | POA: Diagnosis not present

## 2020-09-03 DIAGNOSIS — N3941 Urge incontinence: Secondary | ICD-10-CM | POA: Diagnosis not present

## 2020-09-03 DIAGNOSIS — R8290 Unspecified abnormal findings in urine: Secondary | ICD-10-CM | POA: Diagnosis not present

## 2020-09-03 DIAGNOSIS — Z87440 Personal history of urinary (tract) infections: Secondary | ICD-10-CM | POA: Diagnosis not present

## 2020-09-29 DIAGNOSIS — U071 COVID-19: Secondary | ICD-10-CM | POA: Diagnosis not present

## 2020-09-29 DIAGNOSIS — N3000 Acute cystitis without hematuria: Secondary | ICD-10-CM | POA: Diagnosis not present

## 2020-09-29 DIAGNOSIS — M545 Low back pain, unspecified: Secondary | ICD-10-CM | POA: Diagnosis not present

## 2020-09-29 DIAGNOSIS — R42 Dizziness and giddiness: Secondary | ICD-10-CM | POA: Diagnosis not present

## 2020-09-29 DIAGNOSIS — J029 Acute pharyngitis, unspecified: Secondary | ICD-10-CM | POA: Diagnosis not present

## 2020-09-29 DIAGNOSIS — R059 Cough, unspecified: Secondary | ICD-10-CM | POA: Diagnosis not present

## 2020-09-29 DIAGNOSIS — R001 Bradycardia, unspecified: Secondary | ICD-10-CM | POA: Diagnosis not present

## 2020-09-30 DIAGNOSIS — R001 Bradycardia, unspecified: Secondary | ICD-10-CM | POA: Diagnosis not present

## 2020-10-12 DIAGNOSIS — M545 Low back pain, unspecified: Secondary | ICD-10-CM | POA: Diagnosis not present

## 2020-10-12 DIAGNOSIS — M5440 Lumbago with sciatica, unspecified side: Secondary | ICD-10-CM | POA: Diagnosis not present

## 2020-10-13 ENCOUNTER — Other Ambulatory Visit: Payer: Self-pay

## 2020-10-13 ENCOUNTER — Emergency Department (HOSPITAL_COMMUNITY)
Admission: EM | Admit: 2020-10-13 | Discharge: 2020-10-13 | Disposition: A | Discharge status: Home / Self Care | Payer: PPO | Attending: Emergency Medicine | Admitting: Emergency Medicine

## 2020-10-13 ENCOUNTER — Emergency Department (HOSPITAL_COMMUNITY): Payer: PPO

## 2020-10-13 ENCOUNTER — Encounter (HOSPITAL_COMMUNITY): Payer: Self-pay | Admitting: Emergency Medicine

## 2020-10-13 DIAGNOSIS — Z8616 Personal history of COVID-19: Secondary | ICD-10-CM | POA: Insufficient documentation

## 2020-10-13 DIAGNOSIS — M5440 Lumbago with sciatica, unspecified side: Secondary | ICD-10-CM | POA: Diagnosis not present

## 2020-10-13 DIAGNOSIS — R109 Unspecified abdominal pain: Secondary | ICD-10-CM | POA: Diagnosis not present

## 2020-10-13 DIAGNOSIS — Z96659 Presence of unspecified artificial knee joint: Secondary | ICD-10-CM | POA: Insufficient documentation

## 2020-10-13 DIAGNOSIS — U071 COVID-19: Secondary | ICD-10-CM | POA: Diagnosis not present

## 2020-10-13 DIAGNOSIS — R079 Chest pain, unspecified: Secondary | ICD-10-CM | POA: Diagnosis not present

## 2020-10-13 DIAGNOSIS — R1010 Upper abdominal pain, unspecified: Secondary | ICD-10-CM | POA: Diagnosis not present

## 2020-10-13 DIAGNOSIS — N281 Cyst of kidney, acquired: Secondary | ICD-10-CM | POA: Diagnosis not present

## 2020-10-13 DIAGNOSIS — K7689 Other specified diseases of liver: Secondary | ICD-10-CM | POA: Diagnosis not present

## 2020-10-13 LAB — CBC
HCT: 37.5 % (ref 36.0–46.0)
Hemoglobin: 12.4 g/dL (ref 12.0–15.0)
MCH: 33.8 pg (ref 26.0–34.0)
MCHC: 33.1 g/dL (ref 30.0–36.0)
MCV: 102.2 fL — ABNORMAL HIGH (ref 80.0–100.0)
Platelets: 198 10*3/uL (ref 150–400)
RBC: 3.67 MIL/uL — ABNORMAL LOW (ref 3.87–5.11)
RDW: 12.6 % (ref 11.5–15.5)
WBC: 7.2 10*3/uL (ref 4.0–10.5)
nRBC: 0 % (ref 0.0–0.2)

## 2020-10-13 LAB — COMPREHENSIVE METABOLIC PANEL
ALT: 14 U/L (ref 0–44)
AST: 25 U/L (ref 15–41)
Albumin: 3.9 g/dL (ref 3.5–5.0)
Alkaline Phosphatase: 33 U/L — ABNORMAL LOW (ref 38–126)
Anion gap: 9 (ref 5–15)
BUN: 21 mg/dL (ref 8–23)
CO2: 22 mmol/L (ref 22–32)
Calcium: 8.8 mg/dL — ABNORMAL LOW (ref 8.9–10.3)
Chloride: 110 mmol/L (ref 98–111)
Creatinine, Ser: 0.9 mg/dL (ref 0.44–1.00)
GFR, Estimated: >60 mL/min (ref >60)
Glucose, Bld: 89 mg/dL (ref 70–99)
Potassium: 4.1 mmol/L (ref 3.5–5.1)
Sodium: 141 mmol/L (ref 135–145)
Total Bilirubin: 1 mg/dL (ref 0.3–1.2)
Total Protein: 7 g/dL (ref 6.5–8.1)

## 2020-10-13 LAB — LIPASE, BLOOD: Lipase: 38 U/L (ref 11–51)

## 2020-10-13 LAB — LACTIC ACID, PLASMA: Lactic Acid, Venous: 0.9 mmol/L (ref 0.5–1.9)

## 2020-10-13 LAB — TROPONIN I (HIGH SENSITIVITY): Troponin I (High Sensitivity): 5 ng/L (ref <18)

## 2020-10-13 MED ORDER — IOHEXOL 350 MG/ML SOLN
100.0000 mL | Freq: Once | INTRAVENOUS | Status: AC | PRN
  Administered 2020-10-13: 100 mL via INTRAVENOUS

## 2020-10-13 NOTE — ED Triage Notes (Addendum)
Patient c/o left flank pain radiating to left abdomen intermittently for week. Reports sent from UC for further evaluation. Covid+ x3 weeks ago. Reports significant UTI history, on maintenance abx for years.

## 2020-10-13 NOTE — Discharge Instructions (Addendum)
Please return for any problem.   As discussed, use tylenol or tramadol (as previously prescribed) for pain.

## 2020-10-13 NOTE — ED Provider Notes (Signed)
[facility]-EMERGENCY DEPT Provider Note   CSN: [id number] Arrival date & time: 10/13/20  1424     History Chief Complaint  Patient presents with  . Abdominal Pain  . Flank Pain    Jillian Jackson is a 75 y.o. female.  75 year old female with prior medical history as detailed below presents for evaluation.  Patient complains of persistent left-sided flank discomfort.  This is been ongoing for the last 2 to 3 weeks.  Patient reports that she was diagnosed with Covid approximately 3 weeks ago.  She had minimal symptoms from her Covid infection.  She was not hospitalized.  She denies experiencing significant shortness of breath or coughing.  She reports that she has had this persistent left-sided flank discomfort intermittently since.  She denies associated fever.  She denies abdominal pain.  She denies nausea or vomiting.  She was seen by urgent care earlier this evening for same complaint.  She reports that the urgent care checked her urine and that it was normal without evidence of infection.  She was referred to the ED for evaluation of possible PE.  Patient denies associated palpitations, chest pain, shortness of breath, or other acute complaint.  The history is provided by the patient and medical records.  Abdominal Pain Pain location:  L flank Pain quality: aching   Pain radiates to:  Does not radiate Pain severity:  Mild Onset quality:  Gradual Duration:  3 weeks Timing:  Intermittent Progression:  Waxing and waning Chronicity:  Recurrent Relieved by:  Nothing Worsened by:  Nothing Ineffective treatments:  None tried Flank Pain Associated symptoms include abdominal pain.       History reviewed. No pertinent past medical history.  There are no problems to display for this patient.   Past Surgical History:  Procedure Laterality Date  . REPLACEMENT TOTAL KNEE       OB History   No obstetric history on file.     No family history on file.      Home Medications Prior to Admission medications   Not on File    Allergies    Patient has no known allergies.  Review of Systems   Review of Systems  Gastrointestinal: Positive for abdominal pain.  Genitourinary: Positive for flank pain.  All other systems reviewed and are negative.   Physical Exam Updated Vital Signs BP 111/67 (BP Location: Right Arm)   Pulse (!) 57   Temp (!) 97.5 F (36.4 C) (Oral)   Resp 12   SpO2 100%   Physical Exam Vitals and nursing note reviewed.  Constitutional:      General: She is not in acute distress.    Appearance: She is well-developed and well-nourished.  HENT:     Head: Normocephalic and atraumatic.     Mouth/Throat:     Mouth: Oropharynx is clear and moist.  Eyes:     Extraocular Movements: EOM normal.     Conjunctiva/sclera: Conjunctivae normal.     Pupils: Pupils are equal, round, and reactive to light.  Cardiovascular:     Rate and Rhythm: Normal rate and regular rhythm.     Heart sounds: Normal heart sounds.  Pulmonary:     Effort: Pulmonary effort is normal. No respiratory distress.     Breath sounds: Normal breath sounds.  Abdominal:     General: Abdomen is flat. There is no distension.     Palpations: Abdomen is soft.     Tenderness: There is no abdominal tenderness.  Musculoskeletal:  General: No deformity or edema. Normal range of motion.     Cervical back: Normal range of motion and neck supple.  Skin:    General: Skin is warm and dry.  Neurological:     Mental Status: She is alert and oriented to person, place, and time.  Psychiatric:        Mood and Affect: Mood and affect normal.     ED Results / Procedures / Treatments   Labs (all labs ordered are listed, but only abnormal results are displayed) Labs Reviewed  COMPREHENSIVE METABOLIC PANEL - Abnormal; Notable for the following components:      Result Value   Calcium 8.8 (*)    Alkaline Phosphatase 33 (*)    All other components within normal  limits  CBC - Abnormal; Notable for the following components:   RBC 3.67 (*)    MCV 102.2 (*)    All other components within normal limits  LIPASE, BLOOD  LACTIC ACID, PLASMA  URINALYSIS, ROUTINE W REFLEX MICROSCOPIC  LACTIC ACID, PLASMA  TROPONIN I (HIGH SENSITIVITY)  TROPONIN I (HIGH SENSITIVITY)    EKG EKG Interpretation  Date/Time:  Sunday October 13 2020 18:49:11 EST Ventricular Rate:  50 PR Interval:    QRS Duration: 98 QT Interval:  515 QTC Calculation: 470 R Axis:   1 Text Interpretation: Sinus rhythm Abnormal R-wave progression, early transition Left ventricular hypertrophy Confirmed by Kristine Royal [phone]) on 10/13/2020 6:56:26 PM   Radiology CT Angio Chest PE W and/or Wo Contrast  Result Date: 10/13/2020 CLINICAL DATA:  History of prior COVID-19 positivity with left-sided pain, initial encounter EXAM: CT ANGIOGRAPHY CHEST CT ABDOMEN AND PELVIS WITH CONTRAST TECHNIQUE: Multidetector CT imaging of the chest was performed using the standard protocol during bolus administration of intravenous contrast. Multiplanar CT image reconstructions and MIPs were obtained to evaluate the vascular anatomy. Multidetector CT imaging of the abdomen and pelvis was performed using the standard protocol during bolus administration of intravenous contrast. CONTRAST:  OMNIPAQUE IOHEXOL 350 MG/ML SOLN COMPARISON:  Chest x-ray from earlier in the same day, CT from 11/03/2018 FINDINGS: CTA CHEST FINDINGS Cardiovascular: Thoracic aorta demonstrates a normal branching pattern. Atherosclerotic calcifications are noted. No cardiac enlargement is seen. No coronary calcifications are noted. The pulmonary artery shows a normal branching pattern. No definitive filling defects are noted to suggest pulmonary embolism. Mediastinum/Nodes: Thoracic inlet is within normal limits. No sizable hilar or mediastinal adenopathy is noted. The esophagus as visualized is within normal limits. Lungs/Pleura: Mild  dependent atelectatic changes are noted. No focal confluent infiltrate or sizable effusion is seen. Minimal patchy airspace opacity is noted consistent with the given clinical history of COVID-19 positivity. No effusion is seen. No pneumothorax is noted. Musculoskeletal: Degenerative changes of the thoracic spine are noted. No rib abnormality is noted. Review of the MIP images confirms the above findings. CT ABDOMEN and PELVIS FINDINGS Hepatobiliary: Scattered hypodensities are noted within the liver consistent with small cysts. Gallbladder is within normal limits. Pancreas: Unremarkable. No pancreatic ductal dilatation or surrounding inflammatory changes. Spleen: Normal in size without focal abnormality. Adrenals/Urinary Tract: Adrenal glands are within normal limits. Kidneys demonstrate a normal enhancement pattern bilaterally. No obstructive changes are noted. Small left renal cyst is again noted and stable. Normal excretion is noted on delayed images. Bladder is partially distended. Stomach/Bowel: Scattered diverticular changes noted without evidence of diverticulitis. The appendix is not well visualized and may have been surgically removed. No inflammatory changes are seen. Small bowel and stomach are  within normal limits. Vascular/Lymphatic: Aortic atherosclerosis. No enlarged abdominal or pelvic lymph nodes. Reproductive: Status post hysterectomy. No adnexal masses. Other: No abdominal wall hernia or abnormality. No abdominopelvic ascites. Musculoskeletal: No acute or significant osseous findings. Review of the MIP images confirms the above findings. IMPRESSION: CTA of the chest: No evidence of pulmonary emboli. Minimal patchy airspace opacity consistent with the known history of prior COVID infection. CT of the abdomen and pelvis: Hepatic and renal cysts. Diverticulosis without diverticulitis. No acute abnormality to correspond with the given clinical history is noted. Aortic Atherosclerosis (ICD10-I70.0).  Electronically Signed   By: Alcide Clever M.D.   On: 10/13/2020 21:02   CT Abdomen Pelvis W Contrast  Result Date: 10/13/2020 CLINICAL DATA:  History of prior COVID-19 positivity with left-sided pain, initial encounter EXAM: CT ANGIOGRAPHY CHEST CT ABDOMEN AND PELVIS WITH CONTRAST TECHNIQUE: Multidetector CT imaging of the chest was performed using the standard protocol during bolus administration of intravenous contrast. Multiplanar CT image reconstructions and MIPs were obtained to evaluate the vascular anatomy. Multidetector CT imaging of the abdomen and pelvis was performed using the standard protocol during bolus administration of intravenous contrast. CONTRAST:  OMNIPAQUE IOHEXOL 350 MG/ML SOLN COMPARISON:  Chest x-ray from earlier in the same day, CT from 11/03/2018 FINDINGS: CTA CHEST FINDINGS Cardiovascular: Thoracic aorta demonstrates a normal branching pattern. Atherosclerotic calcifications are noted. No cardiac enlargement is seen. No coronary calcifications are noted. The pulmonary artery shows a normal branching pattern. No definitive filling defects are noted to suggest pulmonary embolism. Mediastinum/Nodes: Thoracic inlet is within normal limits. No sizable hilar or mediastinal adenopathy is noted. The esophagus as visualized is within normal limits. Lungs/Pleura: Mild dependent atelectatic changes are noted. No focal confluent infiltrate or sizable effusion is seen. Minimal patchy airspace opacity is noted consistent with the given clinical history of COVID-19 positivity. No effusion is seen. No pneumothorax is noted. Musculoskeletal: Degenerative changes of the thoracic spine are noted. No rib abnormality is noted. Review of the MIP images confirms the above findings. CT ABDOMEN and PELVIS FINDINGS Hepatobiliary: Scattered hypodensities are noted within the liver consistent with small cysts. Gallbladder is within normal limits. Pancreas: Unremarkable. No pancreatic ductal dilatation or  surrounding inflammatory changes. Spleen: Normal in size without focal abnormality. Adrenals/Urinary Tract: Adrenal glands are within normal limits. Kidneys demonstrate a normal enhancement pattern bilaterally. No obstructive changes are noted. Small left renal cyst is again noted and stable. Normal excretion is noted on delayed images. Bladder is partially distended. Stomach/Bowel: Scattered diverticular changes noted without evidence of diverticulitis. The appendix is not well visualized and may have been surgically removed. No inflammatory changes are seen. Small bowel and stomach are within normal limits. Vascular/Lymphatic: Aortic atherosclerosis. No enlarged abdominal or pelvic lymph nodes. Reproductive: Status post hysterectomy. No adnexal masses. Other: No abdominal wall hernia or abnormality. No abdominopelvic ascites. Musculoskeletal: No acute or significant osseous findings. Review of the MIP images confirms the above findings. IMPRESSION: CTA of the chest: No evidence of pulmonary emboli. Minimal patchy airspace opacity consistent with the known history of prior COVID infection. CT of the abdomen and pelvis: Hepatic and renal cysts. Diverticulosis without diverticulitis. No acute abnormality to correspond with the given clinical history is noted. Aortic Atherosclerosis (ICD10-I70.0). Electronically Signed   By: Alcide Clever M.D.   On: 10/13/2020 21:02   DG Chest Port 1 View  Result Date: 10/13/2020 CLINICAL DATA:  Chest pain.  COVID positive 3 weeks ago. EXAM: PORTABLE CHEST 1 VIEW COMPARISON:  09/19/2020  FINDINGS: The cardiomediastinal contours are normal. The lungs are clear. Pulmonary vasculature is normal. No consolidation, pleural effusion, or pneumothorax. Thoracic spondylosis. No acute osseous abnormalities are seen. IMPRESSION: No acute chest findings or evidence of pneumonia. Electronically Signed   By: Narda Rutherford M.D.   On: 10/13/2020 18:53    Procedures Procedures   Medications  Ordered in ED Medications  iohexol (OMNIPAQUE) 350 MG/ML injection 100 mL (100 mLs Intravenous Contrast Given 10/13/20 2037)    ED Course  I have reviewed the triage vital signs and the nursing notes.  Pertinent labs & imaging results that were available during my care of the patient were reviewed by me and considered in my medical decision making (see chart for details).    MDM Rules/Calculators/A&P                          MDM  Screen complete  Inaya Gillham was evaluated in Emergency Department on 10/13/2020 for the symptoms described in the history of present illness. She was evaluated in the context of the global COVID-19 pandemic, which necessitated consideration that the patient might be at risk for infection with the SARS-CoV-2 virus that causes COVID-19. Institutional protocols and algorithms that pertain to the evaluation of patients at risk for COVID-19 are in a state of rapid change based on information released by regulatory bodies including the CDC and federal and state organizations. These policies and algorithms were followed during the patient's care in the ED.  Patient presented for evaluation of reported left-sided flank discomfort.  Patient's describe symptoms have been ongoing for the last 3 weeks.  Work-up in the ED was without significant findings.  EKG was without evidence of acute ischemia.  Troponin was 5.  CT imaging of the chest abdomen pelvis was without significant acute pathology.  No evidence of PE was found.   Screening labs otherwise without significant abnormality.  Patient declined to provide second urine for UA given that she had a UA checked earlier today at urgent care.   Patient declined further ED evaluation or work-up.  She desires discharge home.  After ED evaluation the patient feels improved.  She desires discharge.  She does understand need for close follow-up with her regular care providers.  Strict return precautions given and  understood.  She plans on using tylenol and ultram (previously prescribed) for recurrent pain.   Final Clinical Impression(s) / ED Diagnoses Final diagnoses:  Flank pain    Rx / DC Orders ED Discharge Orders    None       Wynetta Fines, MD 10/13/20 2159

## 2020-11-06 DIAGNOSIS — Z87440 Personal history of urinary (tract) infections: Secondary | ICD-10-CM | POA: Diagnosis not present

## 2020-11-06 DIAGNOSIS — R109 Unspecified abdominal pain: Secondary | ICD-10-CM | POA: Diagnosis not present

## 2020-11-06 DIAGNOSIS — R1012 Left upper quadrant pain: Secondary | ICD-10-CM | POA: Diagnosis not present

## 2020-11-06 DIAGNOSIS — M549 Dorsalgia, unspecified: Secondary | ICD-10-CM | POA: Diagnosis not present

## 2020-11-06 DIAGNOSIS — R0789 Other chest pain: Secondary | ICD-10-CM | POA: Diagnosis not present

## 2020-11-06 DIAGNOSIS — N1831 Chronic kidney disease, stage 3a: Secondary | ICD-10-CM | POA: Diagnosis not present

## 2020-11-06 DIAGNOSIS — M546 Pain in thoracic spine: Secondary | ICD-10-CM | POA: Diagnosis not present

## 2020-12-18 DIAGNOSIS — N1831 Chronic kidney disease, stage 3a: Secondary | ICD-10-CM | POA: Diagnosis not present

## 2020-12-18 DIAGNOSIS — Z8616 Personal history of COVID-19: Secondary | ICD-10-CM | POA: Diagnosis not present

## 2020-12-18 DIAGNOSIS — M549 Dorsalgia, unspecified: Secondary | ICD-10-CM | POA: Diagnosis not present

## 2020-12-18 DIAGNOSIS — I129 Hypertensive chronic kidney disease with stage 1 through stage 4 chronic kidney disease, or unspecified chronic kidney disease: Secondary | ICD-10-CM | POA: Diagnosis not present

## 2020-12-18 DIAGNOSIS — M8589 Other specified disorders of bone density and structure, multiple sites: Secondary | ICD-10-CM | POA: Diagnosis not present

## 2020-12-18 DIAGNOSIS — Z87440 Personal history of urinary (tract) infections: Secondary | ICD-10-CM | POA: Diagnosis not present

## 2021-01-14 DIAGNOSIS — M549 Dorsalgia, unspecified: Secondary | ICD-10-CM | POA: Diagnosis not present

## 2021-01-14 DIAGNOSIS — I129 Hypertensive chronic kidney disease with stage 1 through stage 4 chronic kidney disease, or unspecified chronic kidney disease: Secondary | ICD-10-CM | POA: Diagnosis not present

## 2021-01-14 DIAGNOSIS — R1012 Left upper quadrant pain: Secondary | ICD-10-CM | POA: Diagnosis not present

## 2021-01-14 DIAGNOSIS — N1831 Chronic kidney disease, stage 3a: Secondary | ICD-10-CM | POA: Diagnosis not present

## 2021-01-14 DIAGNOSIS — E785 Hyperlipidemia, unspecified: Secondary | ICD-10-CM | POA: Diagnosis not present

## 2021-01-14 DIAGNOSIS — M8589 Other specified disorders of bone density and structure, multiple sites: Secondary | ICD-10-CM | POA: Diagnosis not present

## 2021-01-14 DIAGNOSIS — Z87440 Personal history of urinary (tract) infections: Secondary | ICD-10-CM | POA: Diagnosis not present

## 2021-01-14 DIAGNOSIS — E039 Hypothyroidism, unspecified: Secondary | ICD-10-CM | POA: Diagnosis not present

## 2021-01-29 DIAGNOSIS — G8929 Other chronic pain: Secondary | ICD-10-CM | POA: Diagnosis not present

## 2021-01-29 DIAGNOSIS — Q453 Other congenital malformations of pancreas and pancreatic duct: Secondary | ICD-10-CM | POA: Diagnosis not present

## 2021-01-29 DIAGNOSIS — R1012 Left upper quadrant pain: Secondary | ICD-10-CM | POA: Diagnosis not present

## 2021-01-29 DIAGNOSIS — R109 Unspecified abdominal pain: Secondary | ICD-10-CM | POA: Diagnosis not present

## 2021-03-06 DIAGNOSIS — G8929 Other chronic pain: Secondary | ICD-10-CM | POA: Diagnosis not present

## 2021-03-06 DIAGNOSIS — I129 Hypertensive chronic kidney disease with stage 1 through stage 4 chronic kidney disease, or unspecified chronic kidney disease: Secondary | ICD-10-CM | POA: Diagnosis not present

## 2021-03-06 DIAGNOSIS — K219 Gastro-esophageal reflux disease without esophagitis: Secondary | ICD-10-CM | POA: Diagnosis not present

## 2021-03-06 DIAGNOSIS — E890 Postprocedural hypothyroidism: Secondary | ICD-10-CM | POA: Diagnosis not present

## 2021-03-06 DIAGNOSIS — K449 Diaphragmatic hernia without obstruction or gangrene: Secondary | ICD-10-CM | POA: Diagnosis not present

## 2021-03-06 DIAGNOSIS — R109 Unspecified abdominal pain: Secondary | ICD-10-CM | POA: Diagnosis not present

## 2021-03-06 DIAGNOSIS — F419 Anxiety disorder, unspecified: Secondary | ICD-10-CM | POA: Diagnosis not present

## 2021-03-06 DIAGNOSIS — M8589 Other specified disorders of bone density and structure, multiple sites: Secondary | ICD-10-CM | POA: Diagnosis not present

## 2021-03-06 DIAGNOSIS — R1012 Left upper quadrant pain: Secondary | ICD-10-CM | POA: Diagnosis not present

## 2021-03-06 DIAGNOSIS — Z96641 Presence of right artificial hip joint: Secondary | ICD-10-CM | POA: Diagnosis not present

## 2021-03-06 DIAGNOSIS — I10 Essential (primary) hypertension: Secondary | ICD-10-CM | POA: Diagnosis not present

## 2021-03-06 DIAGNOSIS — N390 Urinary tract infection, site not specified: Secondary | ICD-10-CM | POA: Diagnosis not present

## 2021-03-06 DIAGNOSIS — N1831 Chronic kidney disease, stage 3a: Secondary | ICD-10-CM | POA: Diagnosis not present

## 2021-03-17 DIAGNOSIS — R1012 Left upper quadrant pain: Secondary | ICD-10-CM | POA: Diagnosis not present

## 2021-03-17 DIAGNOSIS — G8929 Other chronic pain: Secondary | ICD-10-CM | POA: Diagnosis not present

## 2021-03-17 DIAGNOSIS — M546 Pain in thoracic spine: Secondary | ICD-10-CM | POA: Diagnosis not present

## 2021-03-17 DIAGNOSIS — M545 Low back pain, unspecified: Secondary | ICD-10-CM | POA: Diagnosis not present

## 2021-03-20 DIAGNOSIS — R918 Other nonspecific abnormal finding of lung field: Secondary | ICD-10-CM | POA: Diagnosis not present

## 2021-03-20 DIAGNOSIS — K7689 Other specified diseases of liver: Secondary | ICD-10-CM | POA: Diagnosis not present

## 2021-03-20 DIAGNOSIS — R911 Solitary pulmonary nodule: Secondary | ICD-10-CM | POA: Diagnosis not present

## 2021-03-20 DIAGNOSIS — I700 Atherosclerosis of aorta: Secondary | ICD-10-CM | POA: Diagnosis not present

## 2021-03-26 DIAGNOSIS — Z780 Asymptomatic menopausal state: Secondary | ICD-10-CM | POA: Diagnosis not present

## 2021-03-26 DIAGNOSIS — M8589 Other specified disorders of bone density and structure, multiple sites: Secondary | ICD-10-CM | POA: Diagnosis not present

## 2021-04-01 DIAGNOSIS — Z86010 Personal history of colonic polyps: Secondary | ICD-10-CM | POA: Diagnosis not present

## 2021-04-01 DIAGNOSIS — R1012 Left upper quadrant pain: Secondary | ICD-10-CM | POA: Diagnosis not present

## 2021-04-01 DIAGNOSIS — R935 Abnormal findings on diagnostic imaging of other abdominal regions, including retroperitoneum: Secondary | ICD-10-CM | POA: Diagnosis not present

## 2021-04-02 DIAGNOSIS — M47816 Spondylosis without myelopathy or radiculopathy, lumbar region: Secondary | ICD-10-CM | POA: Diagnosis not present

## 2021-04-14 DIAGNOSIS — R935 Abnormal findings on diagnostic imaging of other abdominal regions, including retroperitoneum: Secondary | ICD-10-CM | POA: Diagnosis not present

## 2021-04-14 DIAGNOSIS — R1012 Left upper quadrant pain: Secondary | ICD-10-CM | POA: Diagnosis not present

## 2021-04-14 DIAGNOSIS — N281 Cyst of kidney, acquired: Secondary | ICD-10-CM | POA: Diagnosis not present

## 2021-04-14 DIAGNOSIS — K7689 Other specified diseases of liver: Secondary | ICD-10-CM | POA: Diagnosis not present

## 2021-04-14 DIAGNOSIS — I700 Atherosclerosis of aorta: Secondary | ICD-10-CM | POA: Diagnosis not present

## 2021-04-14 DIAGNOSIS — Q453 Other congenital malformations of pancreas and pancreatic duct: Secondary | ICD-10-CM | POA: Diagnosis not present

## 2021-04-28 DIAGNOSIS — R1012 Left upper quadrant pain: Secondary | ICD-10-CM | POA: Diagnosis not present

## 2021-04-28 DIAGNOSIS — M545 Low back pain, unspecified: Secondary | ICD-10-CM | POA: Diagnosis not present

## 2021-04-28 DIAGNOSIS — N1831 Chronic kidney disease, stage 3a: Secondary | ICD-10-CM | POA: Diagnosis not present

## 2021-04-28 DIAGNOSIS — F419 Anxiety disorder, unspecified: Secondary | ICD-10-CM | POA: Diagnosis not present

## 2021-04-28 DIAGNOSIS — E890 Postprocedural hypothyroidism: Secondary | ICD-10-CM | POA: Diagnosis not present

## 2021-04-28 DIAGNOSIS — G8929 Other chronic pain: Secondary | ICD-10-CM | POA: Diagnosis not present

## 2021-04-28 DIAGNOSIS — K449 Diaphragmatic hernia without obstruction or gangrene: Secondary | ICD-10-CM | POA: Diagnosis not present

## 2021-04-28 DIAGNOSIS — N390 Urinary tract infection, site not specified: Secondary | ICD-10-CM | POA: Diagnosis not present

## 2021-04-28 DIAGNOSIS — I129 Hypertensive chronic kidney disease with stage 1 through stage 4 chronic kidney disease, or unspecified chronic kidney disease: Secondary | ICD-10-CM | POA: Diagnosis not present

## 2021-04-28 DIAGNOSIS — K219 Gastro-esophageal reflux disease without esophagitis: Secondary | ICD-10-CM | POA: Diagnosis not present

## 2021-04-28 DIAGNOSIS — M8589 Other specified disorders of bone density and structure, multiple sites: Secondary | ICD-10-CM | POA: Diagnosis not present

## 2021-06-06 DIAGNOSIS — Z0000 Encounter for general adult medical examination without abnormal findings: Secondary | ICD-10-CM | POA: Diagnosis not present

## 2021-06-06 DIAGNOSIS — I129 Hypertensive chronic kidney disease with stage 1 through stage 4 chronic kidney disease, or unspecified chronic kidney disease: Secondary | ICD-10-CM | POA: Diagnosis not present

## 2021-06-06 DIAGNOSIS — H9193 Unspecified hearing loss, bilateral: Secondary | ICD-10-CM | POA: Diagnosis not present

## 2021-06-06 DIAGNOSIS — M8589 Other specified disorders of bone density and structure, multiple sites: Secondary | ICD-10-CM | POA: Diagnosis not present

## 2021-06-06 DIAGNOSIS — G8929 Other chronic pain: Secondary | ICD-10-CM | POA: Diagnosis not present

## 2021-06-06 DIAGNOSIS — M545 Low back pain, unspecified: Secondary | ICD-10-CM | POA: Diagnosis not present

## 2021-06-06 DIAGNOSIS — E890 Postprocedural hypothyroidism: Secondary | ICD-10-CM | POA: Diagnosis not present

## 2021-06-06 DIAGNOSIS — N1831 Chronic kidney disease, stage 3a: Secondary | ICD-10-CM | POA: Diagnosis not present

## 2021-06-06 DIAGNOSIS — E785 Hyperlipidemia, unspecified: Secondary | ICD-10-CM | POA: Diagnosis not present

## 2021-06-06 DIAGNOSIS — K449 Diaphragmatic hernia without obstruction or gangrene: Secondary | ICD-10-CM | POA: Diagnosis not present

## 2021-06-06 DIAGNOSIS — N390 Urinary tract infection, site not specified: Secondary | ICD-10-CM | POA: Diagnosis not present

## 2021-06-06 DIAGNOSIS — R1012 Left upper quadrant pain: Secondary | ICD-10-CM | POA: Diagnosis not present

## 2021-06-06 DIAGNOSIS — F419 Anxiety disorder, unspecified: Secondary | ICD-10-CM | POA: Diagnosis not present

## 2021-06-06 DIAGNOSIS — Z23 Encounter for immunization: Secondary | ICD-10-CM | POA: Diagnosis not present

## 2021-06-12 DIAGNOSIS — Z1231 Encounter for screening mammogram for malignant neoplasm of breast: Secondary | ICD-10-CM | POA: Diagnosis not present

## 2021-07-28 DIAGNOSIS — H903 Sensorineural hearing loss, bilateral: Secondary | ICD-10-CM | POA: Diagnosis not present

## 2021-08-26 DIAGNOSIS — R350 Frequency of micturition: Secondary | ICD-10-CM | POA: Diagnosis not present

## 2021-08-26 DIAGNOSIS — N3941 Urge incontinence: Secondary | ICD-10-CM | POA: Diagnosis not present

## 2021-08-26 DIAGNOSIS — N390 Urinary tract infection, site not specified: Secondary | ICD-10-CM | POA: Diagnosis not present

## 2021-09-03 DIAGNOSIS — I129 Hypertensive chronic kidney disease with stage 1 through stage 4 chronic kidney disease, or unspecified chronic kidney disease: Secondary | ICD-10-CM | POA: Diagnosis not present

## 2021-09-03 DIAGNOSIS — K219 Gastro-esophageal reflux disease without esophagitis: Secondary | ICD-10-CM | POA: Diagnosis not present

## 2021-09-03 DIAGNOSIS — E890 Postprocedural hypothyroidism: Secondary | ICD-10-CM | POA: Diagnosis not present

## 2021-09-03 DIAGNOSIS — E785 Hyperlipidemia, unspecified: Secondary | ICD-10-CM | POA: Diagnosis not present

## 2021-09-03 DIAGNOSIS — D7589 Other specified diseases of blood and blood-forming organs: Secondary | ICD-10-CM | POA: Diagnosis not present

## 2021-09-03 DIAGNOSIS — Z5181 Encounter for therapeutic drug level monitoring: Secondary | ICD-10-CM | POA: Diagnosis not present

## 2021-09-03 DIAGNOSIS — N3941 Urge incontinence: Secondary | ICD-10-CM | POA: Diagnosis not present

## 2021-09-03 DIAGNOSIS — K449 Diaphragmatic hernia without obstruction or gangrene: Secondary | ICD-10-CM | POA: Diagnosis not present

## 2021-09-03 DIAGNOSIS — N1831 Chronic kidney disease, stage 3a: Secondary | ICD-10-CM | POA: Diagnosis not present

## 2021-09-03 DIAGNOSIS — I10 Essential (primary) hypertension: Secondary | ICD-10-CM | POA: Diagnosis not present

## 2021-09-03 DIAGNOSIS — R252 Cramp and spasm: Secondary | ICD-10-CM | POA: Diagnosis not present

## 2021-09-03 DIAGNOSIS — M8589 Other specified disorders of bone density and structure, multiple sites: Secondary | ICD-10-CM | POA: Diagnosis not present

## 2021-09-03 DIAGNOSIS — Z23 Encounter for immunization: Secondary | ICD-10-CM | POA: Diagnosis not present

## 2022-12-01 IMAGING — CT CT ABD-PELV W/ CM
2 of 5 series · 15 of 46 positions shown, 17 images · IV contrast (omnipaque)
Comparison: Chest x-ray from earlier in the same day, CT from
11/03/2018

CLINICAL DATA: History of prior QZTHV-NE positivity with left-sided
pain, initial encounter

EXAM:
CT ANGIOGRAPHY CHEST
CT ABDOMEN AND PELVIS WITH CONTRAST
TECHNIQUE: Multidetector CT imaging of the chest was performed using the
standard protocol during bolus administration of intravenous
contrast. Multiplanar CT image reconstructions and MIPs were
obtained to evaluate the vascular anatomy. Multidetector CT imaging
of the abdomen and pelvis was performed using the standard protocol
during bolus administration of intravenous contrast.
CONTRAST:  100mL OMNIPAQUE IOHEXOL 350 MG/ML SOLN

[Series 4: axial st · axial · 0.73mm/px · z∈[-576,-226]mm · 12 of 80 slices shown, 14 images]
[im 5/80  soft-tissue]
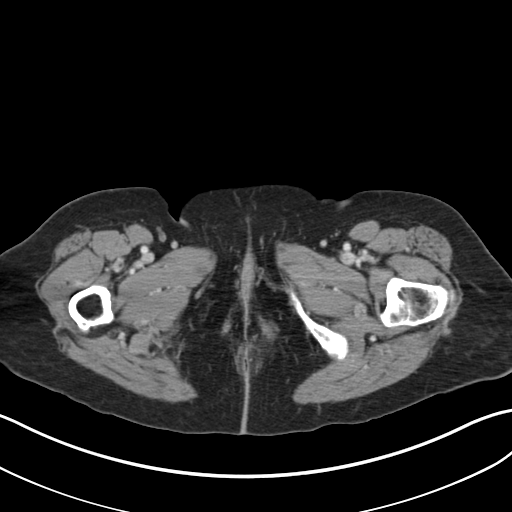
[im 5/80  bone]
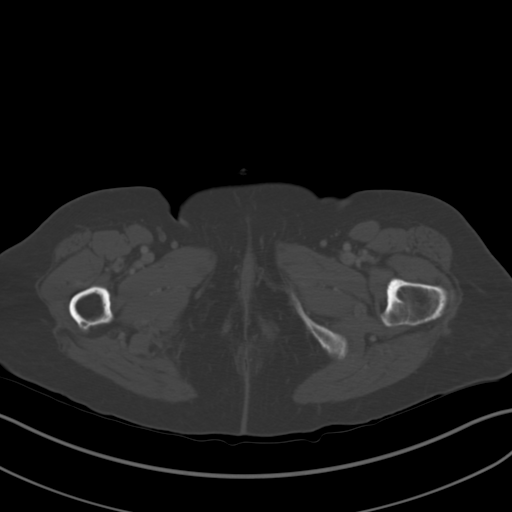
[im 10/80  soft-tissue]
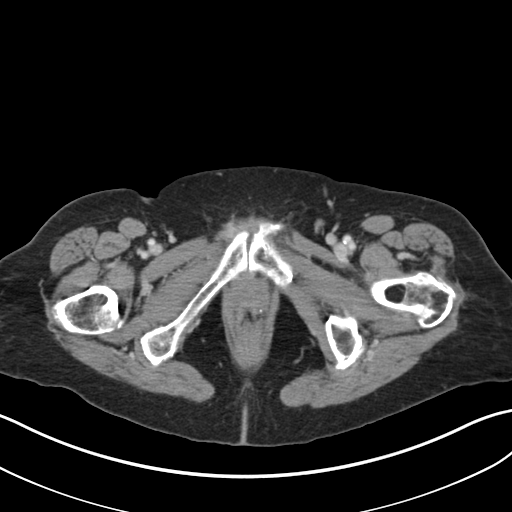
[im 20/80  soft-tissue]
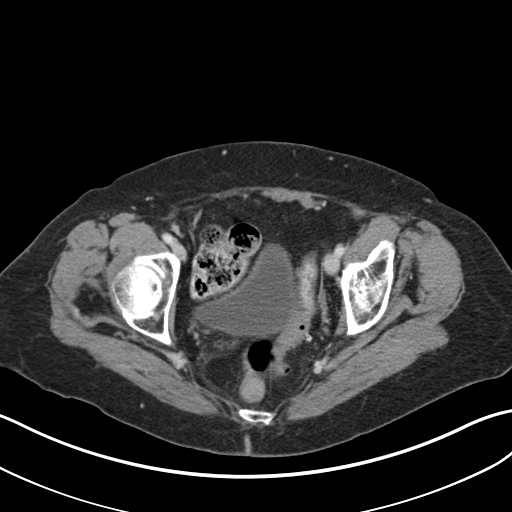
[im 25/80  soft-tissue]
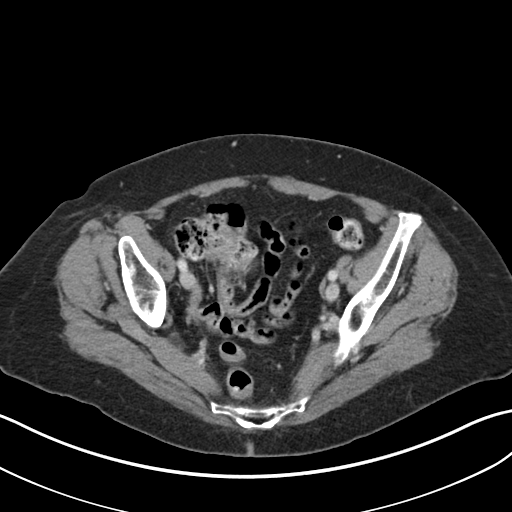
[im 30/80  soft-tissue]
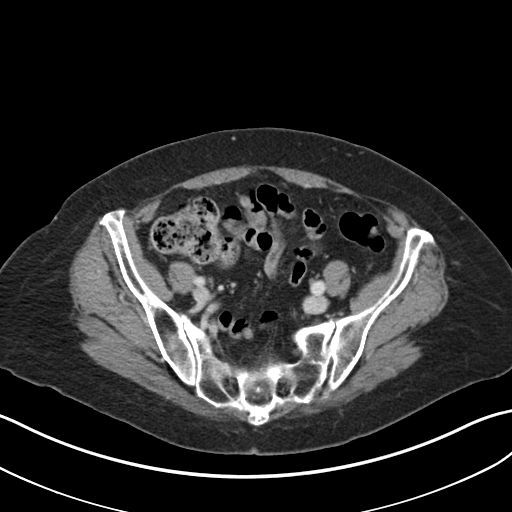
[im 35/80  soft-tissue]
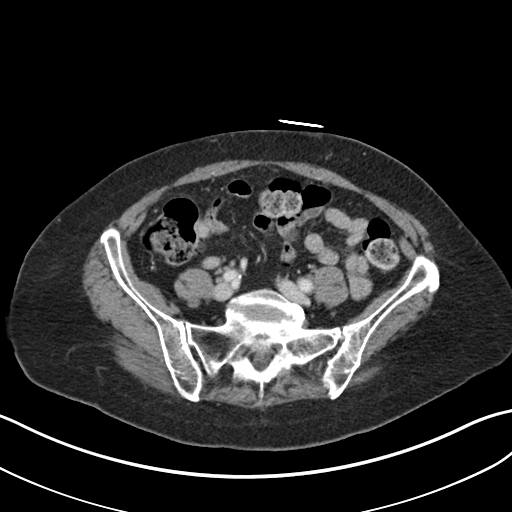
[im 45/80  soft-tissue]
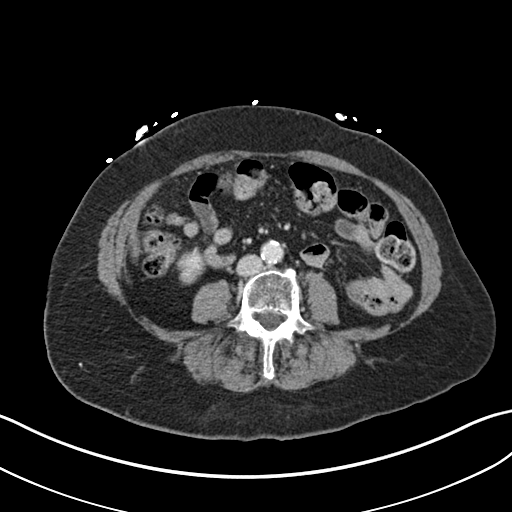
[im 50/80  soft-tissue]
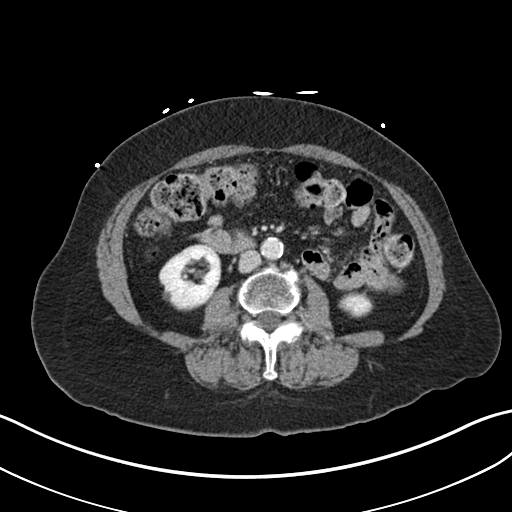
[im 55/80  soft-tissue]
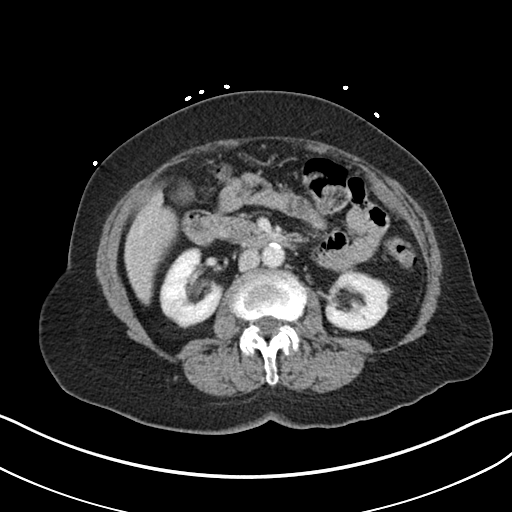
[im 55/80  bone]
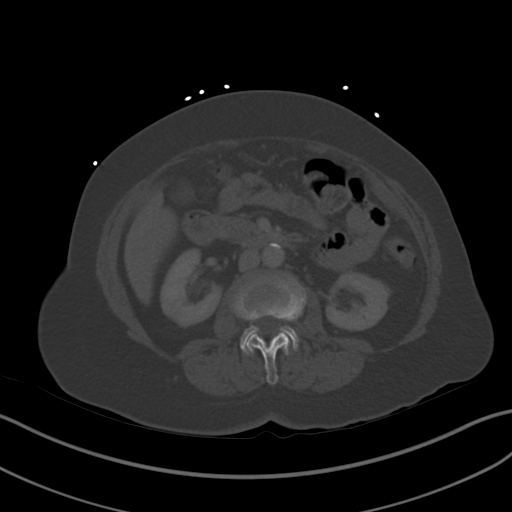
[im 60/80  soft-tissue]
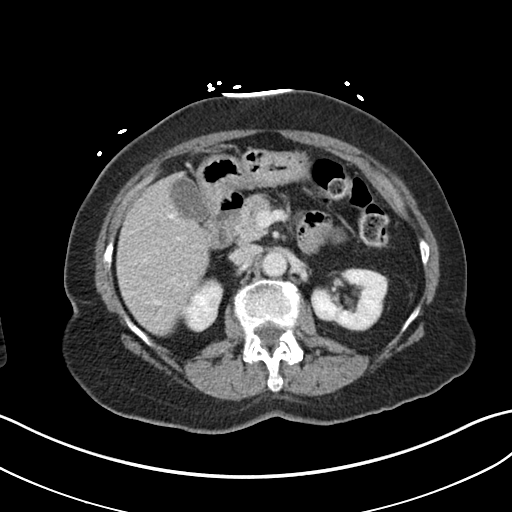
[im 70/80  soft-tissue]
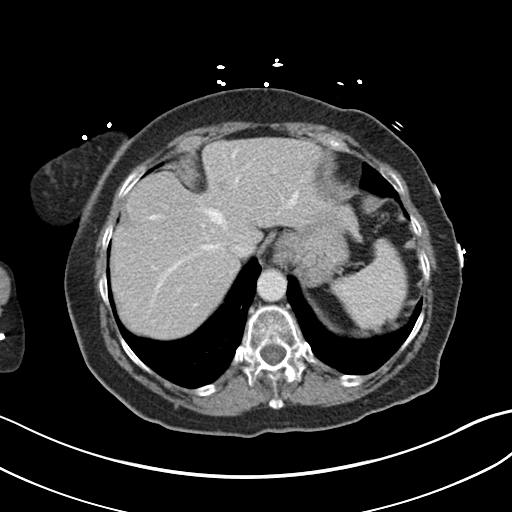
[im 75/80  soft-tissue]
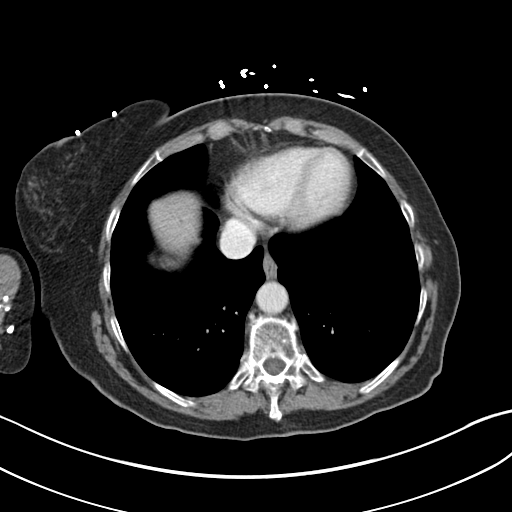

[Series 8: coronal st · coronal · 0.69mm/px · 3 of 100 slices shown]
[im 34/100  soft-tissue]
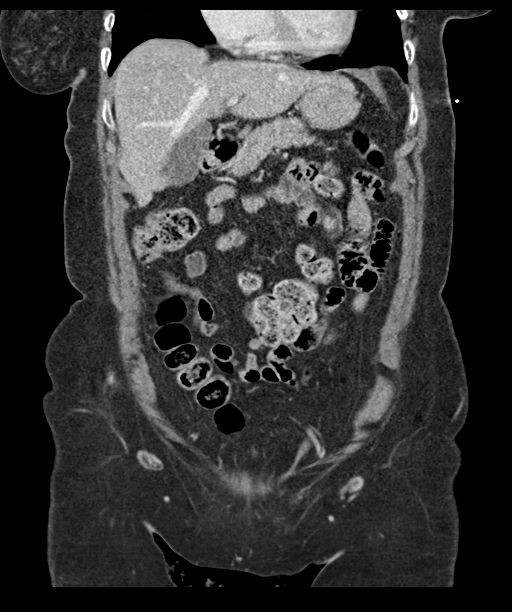
[im 45/100  soft-tissue]
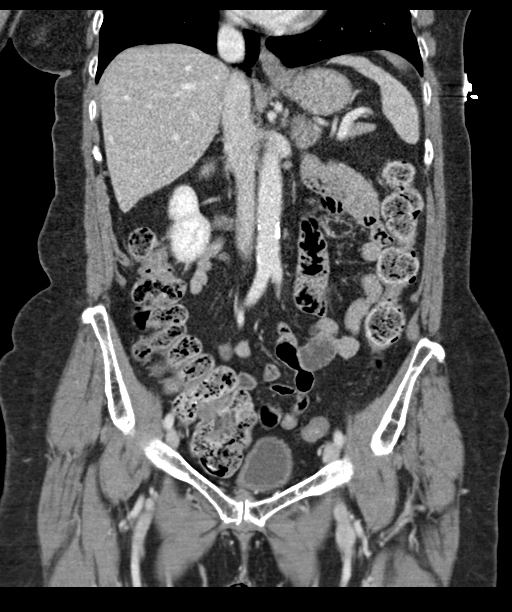
[im 56/100  soft-tissue]
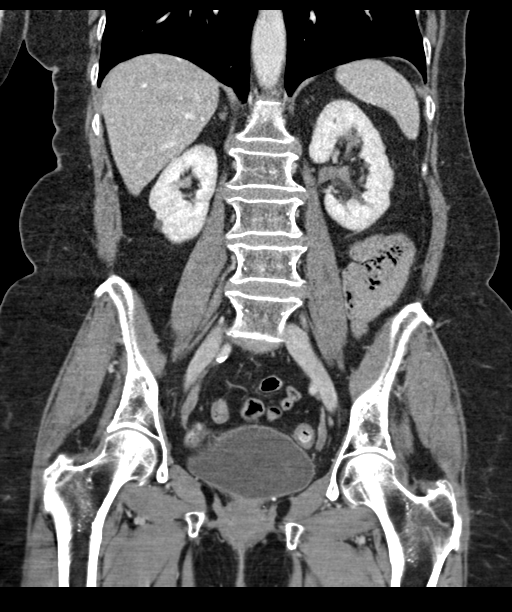

[15 of 46 positions shown; findings below may reference images not displayed]

FINDINGS: CTA CHEST FINDINGS

Cardiovascular: Thoracic aorta demonstrates a normal branching
pattern. Atherosclerotic calcifications are noted. No cardiac
enlargement is seen. No coronary calcifications are noted. The
pulmonary artery shows a normal branching pattern. No definitive
filling defects are noted to suggest pulmonary embolism.

Mediastinum/Nodes: Thoracic inlet is within normal limits. No
sizable hilar or mediastinal adenopathy is noted. The esophagus as
visualized is within normal limits.

Lungs/Pleura: Mild dependent atelectatic changes are noted. No focal
confluent infiltrate or sizable effusion is seen. Minimal patchy
airspace opacity is noted consistent with the given clinical history
of QZTHV-NE positivity. No effusion is seen. No pneumothorax is
noted.

Musculoskeletal: Degenerative changes of the thoracic spine are
noted. No rib abnormality is noted.

Review of the MIP images confirms the above findings.

CT ABDOMEN and PELVIS FINDINGS

Hepatobiliary: Scattered hypodensities are noted within the liver
consistent with small cysts. Gallbladder is within normal limits.

Pancreas: Unremarkable. No pancreatic ductal dilatation or
surrounding inflammatory changes.

Spleen: Normal in size without focal abnormality.

Adrenals/Urinary Tract: Adrenal glands are within normal limits.
Kidneys demonstrate a normal enhancement pattern bilaterally. No
obstructive changes are noted. Small left renal cyst is again noted
and stable. Normal excretion is noted on delayed images. Bladder is
partially distended.

Stomach/Bowel: Scattered diverticular changes noted without evidence
of diverticulitis. The appendix is not well visualized and may have
been surgically removed. No inflammatory changes are seen. Small
bowel and stomach are within normal limits.

Vascular/Lymphatic: Aortic atherosclerosis. No enlarged abdominal or
pelvic lymph nodes.

Reproductive: Status post hysterectomy. No adnexal masses.

Other: No abdominal wall hernia or abnormality. No abdominopelvic
ascites.

Musculoskeletal: No acute or significant osseous findings.

Review of the MIP images confirms the above findings.
IMPRESSION: CTA of the chest: No evidence of pulmonary emboli.

Minimal patchy airspace opacity consistent with the known history of
prior COVID infection.

CT of the abdomen and pelvis: Hepatic and renal cysts.

Diverticulosis without diverticulitis. No acute abnormality to
correspond with the given clinical history is noted.

Aortic Atherosclerosis (NKDVQ-FBA.A).

## 2022-12-01 IMAGING — CT CT ANGIO CHEST
2 of 6 series · 17 of 46 positions shown · IV contrast (omnipaque)
Comparison: Chest x-ray from earlier in the same day, CT from
11/03/2018

CLINICAL DATA: History of prior QZTHV-NE positivity with left-sided
pain, initial encounter

EXAM:
CT ANGIOGRAPHY CHEST
CT ABDOMEN AND PELVIS WITH CONTRAST
TECHNIQUE: Multidetector CT imaging of the chest was performed using the
standard protocol during bolus administration of intravenous
contrast. Multiplanar CT image reconstructions and MIPs were
obtained to evaluate the vascular anatomy. Multidetector CT imaging
of the abdomen and pelvis was performed using the standard protocol
during bolus administration of intravenous contrast.
CONTRAST:  100mL OMNIPAQUE IOHEXOL 350 MG/ML SOLN

[Series 3: thins · axial · 0.64mm/px · z∈[-287,-35]mm · 14 of 278 slices shown]
[im 13/278  lung]
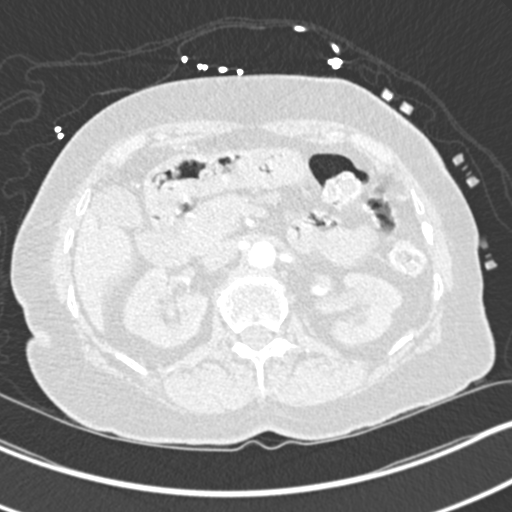
[im 37/278  soft-tissue]
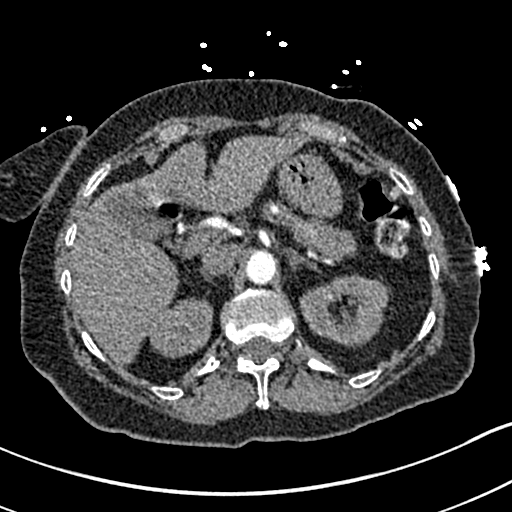
[im 49/278  lung]
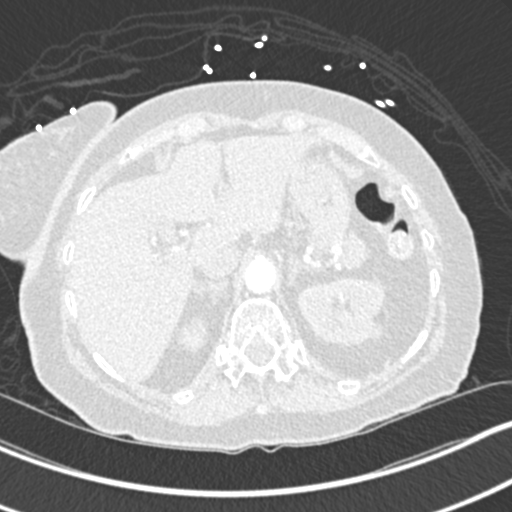
[im 73/278  soft-tissue]
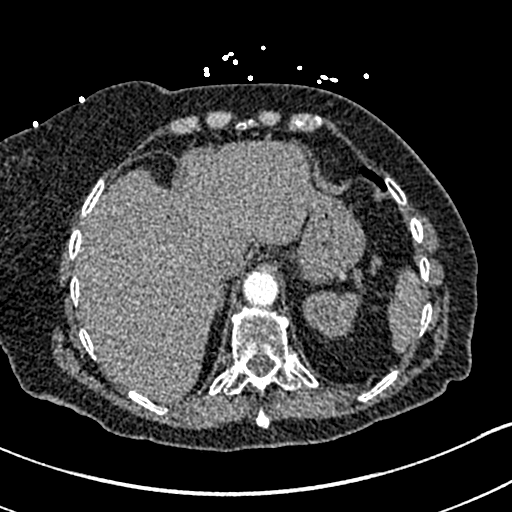
[im 97/278  lung]
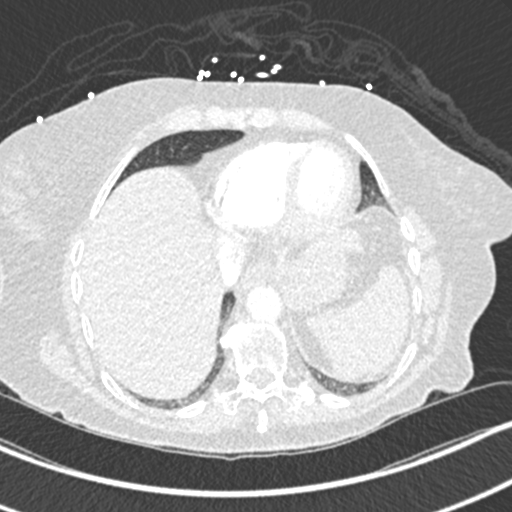
[im 109/278  soft-tissue]
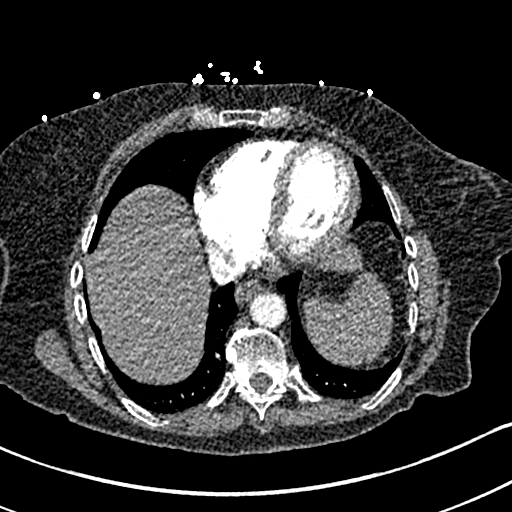
[im 133/278  lung]
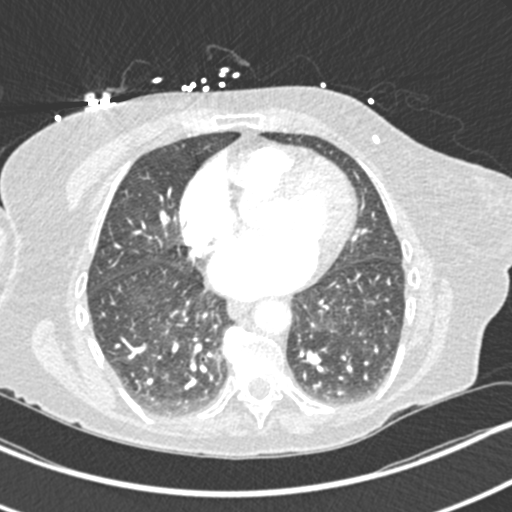
[im 145/278  soft-tissue]
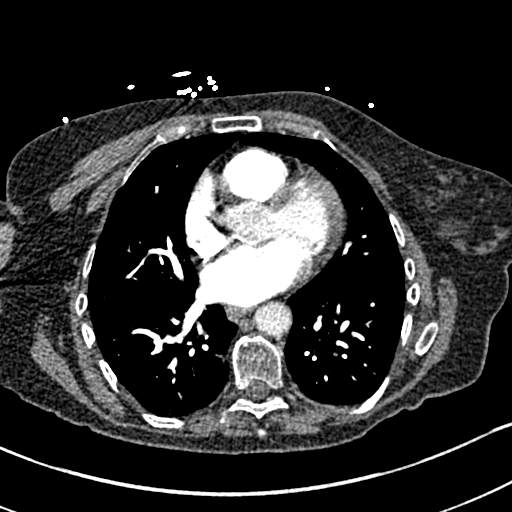
[im 169/278  lung]
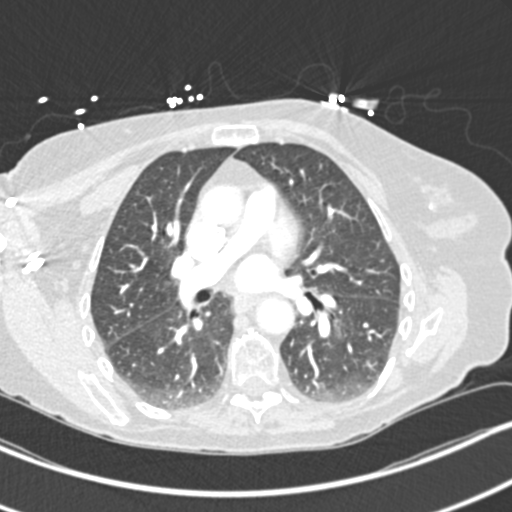
[im 181/278  soft-tissue]
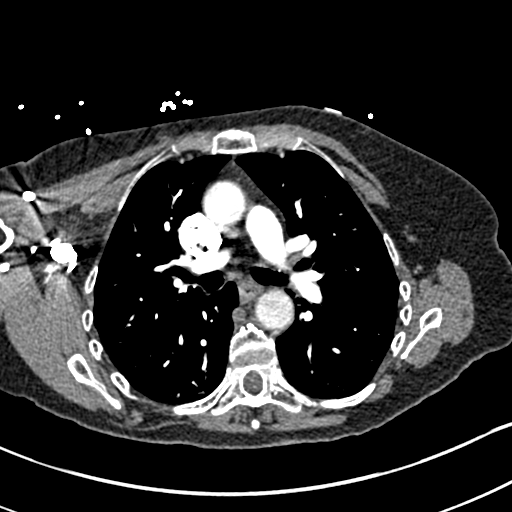
[im 205/278  lung]
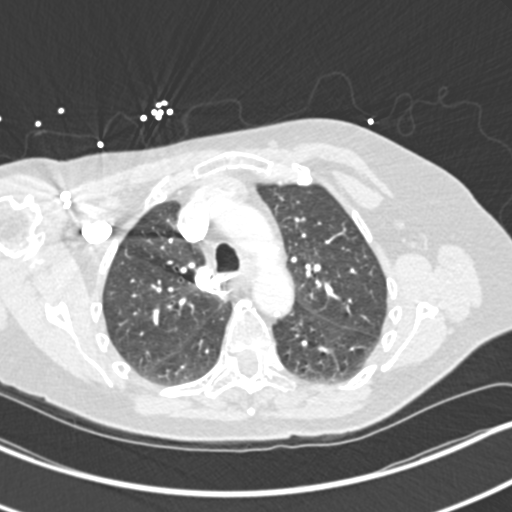
[im 229/278  soft-tissue]
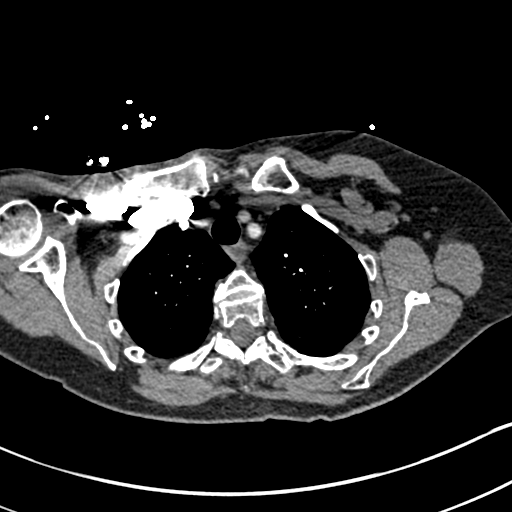
[im 241/278  lung]
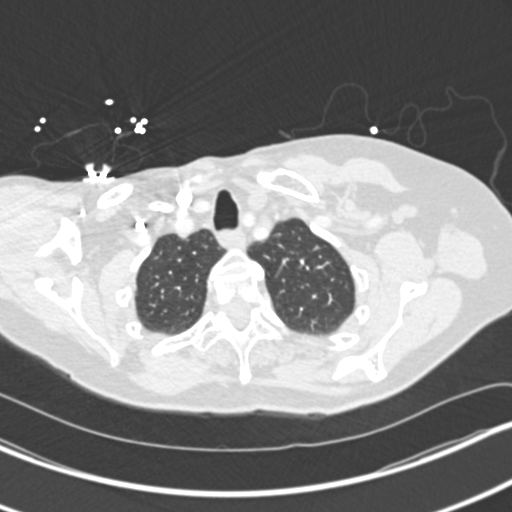
[im 265/278  soft-tissue]
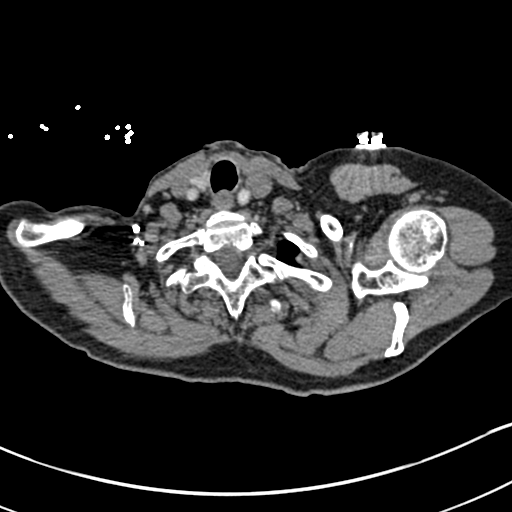

[Series 5: coronal mpr · coronal · 0.58mm/px · 3 of 134 slices shown]
[im 34/134  soft-tissue]
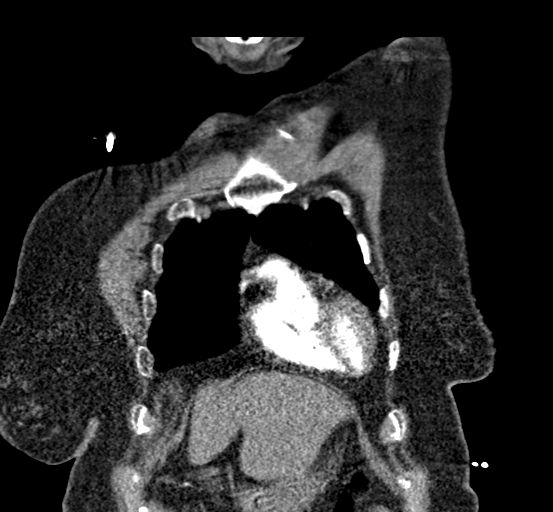
[im 67/134  soft-tissue]
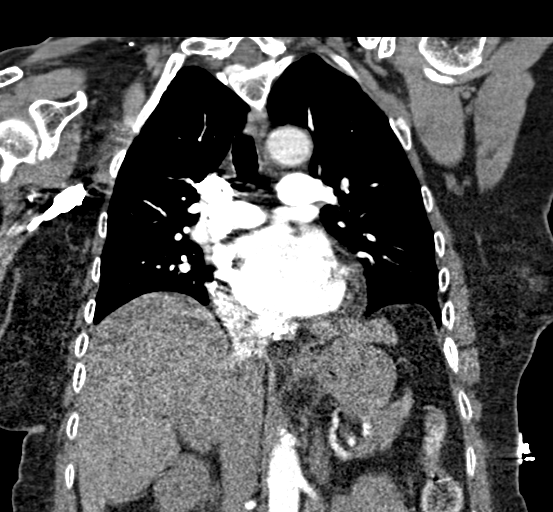
[im 100/134  soft-tissue]
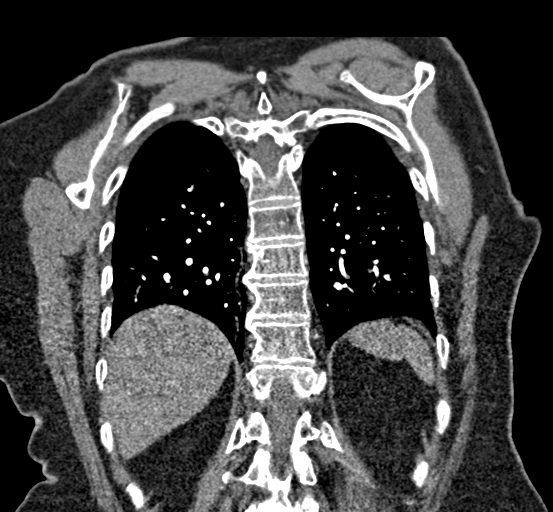

[17 of 46 positions shown; findings below may reference images not displayed]

FINDINGS: CTA CHEST FINDINGS

Cardiovascular: Thoracic aorta demonstrates a normal branching
pattern. Atherosclerotic calcifications are noted. No cardiac
enlargement is seen. No coronary calcifications are noted. The
pulmonary artery shows a normal branching pattern. No definitive
filling defects are noted to suggest pulmonary embolism.

Mediastinum/Nodes: Thoracic inlet is within normal limits. No
sizable hilar or mediastinal adenopathy is noted. The esophagus as
visualized is within normal limits.

Lungs/Pleura: Mild dependent atelectatic changes are noted. No focal
confluent infiltrate or sizable effusion is seen. Minimal patchy
airspace opacity is noted consistent with the given clinical history
of QZTHV-NE positivity. No effusion is seen. No pneumothorax is
noted.

Musculoskeletal: Degenerative changes of the thoracic spine are
noted. No rib abnormality is noted.

Review of the MIP images confirms the above findings.

CT ABDOMEN and PELVIS FINDINGS

Hepatobiliary: Scattered hypodensities are noted within the liver
consistent with small cysts. Gallbladder is within normal limits.

Pancreas: Unremarkable. No pancreatic ductal dilatation or
surrounding inflammatory changes.

Spleen: Normal in size without focal abnormality.

Adrenals/Urinary Tract: Adrenal glands are within normal limits.
Kidneys demonstrate a normal enhancement pattern bilaterally. No
obstructive changes are noted. Small left renal cyst is again noted
and stable. Normal excretion is noted on delayed images. Bladder is
partially distended.

Stomach/Bowel: Scattered diverticular changes noted without evidence
of diverticulitis. The appendix is not well visualized and may have
been surgically removed. No inflammatory changes are seen. Small
bowel and stomach are within normal limits.

Vascular/Lymphatic: Aortic atherosclerosis. No enlarged abdominal or
pelvic lymph nodes.

Reproductive: Status post hysterectomy. No adnexal masses.

Other: No abdominal wall hernia or abnormality. No abdominopelvic
ascites.

Musculoskeletal: No acute or significant osseous findings.

Review of the MIP images confirms the above findings.
IMPRESSION: CTA of the chest: No evidence of pulmonary emboli.

Minimal patchy airspace opacity consistent with the known history of
prior COVID infection.

CT of the abdomen and pelvis: Hepatic and renal cysts.

Diverticulosis without diverticulitis. No acute abnormality to
correspond with the given clinical history is noted.

Aortic Atherosclerosis (NKDVQ-FBA.A).

## 2023-05-09 ENCOUNTER — Emergency Department (HOSPITAL_BASED_OUTPATIENT_CLINIC_OR_DEPARTMENT_OTHER): Payer: PPO

## 2023-05-09 ENCOUNTER — Encounter (HOSPITAL_BASED_OUTPATIENT_CLINIC_OR_DEPARTMENT_OTHER): Payer: Self-pay

## 2023-05-09 ENCOUNTER — Emergency Department (HOSPITAL_BASED_OUTPATIENT_CLINIC_OR_DEPARTMENT_OTHER)
Admission: EM | Admit: 2023-05-09 | Discharge: 2023-05-09 | Disposition: A | Discharge status: Home / Self Care | Payer: PPO | Attending: Emergency Medicine | Admitting: Emergency Medicine

## 2023-05-09 ENCOUNTER — Other Ambulatory Visit: Payer: Self-pay

## 2023-05-09 DIAGNOSIS — R52 Pain, unspecified: Secondary | ICD-10-CM

## 2023-05-09 DIAGNOSIS — S8265XA Nondisplaced fracture of lateral malleolus of left fibula, initial encounter for closed fracture: Secondary | ICD-10-CM | POA: Insufficient documentation

## 2023-05-09 DIAGNOSIS — W1842XA Slipping, tripping and stumbling without falling due to stepping into hole or opening, initial encounter: Secondary | ICD-10-CM | POA: Diagnosis not present

## 2023-05-09 DIAGNOSIS — S8992XA Unspecified injury of left lower leg, initial encounter: Secondary | ICD-10-CM | POA: Diagnosis present

## 2023-05-09 HISTORY — DX: Essential (primary) hypertension: I10

## 2023-05-09 NOTE — ED Triage Notes (Signed)
The patient stepped in a hole on Monday. She is having right side ankle pain.

## 2023-05-09 NOTE — Discharge Instructions (Addendum)
Make an appointment to follow-up with your orthopedic surgeon.  Return to the emergency room if you have any worsening symptoms.

## 2023-05-09 NOTE — ED Provider Notes (Signed)
Jillian Jackson Provider Note   CSN: [id number] Arrival date & time: 05/09/23  1114     History  Chief Complaint  Patient presents with   Ankle Pain    Jillian Jackson is a 77 y.o. female.  Patient is a 77 year old female who presents with pain to her left ankle.  She states that she stepped in a hole about 6 days ago and twisted her ankle.  She denies any other injuries.  She denies hitting her head.  No neck or back pain.  She has had ongoing pain and swelling to the ankle.       Home Medications Prior to Admission medications   Not on File      Allergies    Duloxetine, Hydrocodone-acetaminophen, Oxycodone, Benazepril, Cefuroxime, Codeine, and Ibuprofen    Review of Systems   Review of Systems  Constitutional:  Negative for fever.  Gastrointestinal:  Negative for nausea and vomiting.  Musculoskeletal:  Positive for arthralgias and joint swelling. Negative for back pain and neck pain.  Skin:  Negative for wound.  Neurological:  Negative for weakness, numbness and headaches.    Physical Exam Updated Vital Signs BP 109/85 (BP Location: Left Arm)   Pulse (!) 56   Temp 97.8 F (36.6 C)   Resp 18   Ht 5' (1.524 m)   Wt 54.4 kg   SpO2 100%   BMI 23.44 kg/m  Physical Exam Constitutional:      Appearance: She is well-developed.  HENT:     Head: Normocephalic and atraumatic.  Cardiovascular:     Rate and Rhythm: Normal rate.  Pulmonary:     Effort: Pulmonary effort is normal.  Musculoskeletal:        General: Tenderness present.     Cervical back: Normal range of motion and neck supple.     Comments: Small amount of ecchymosis around the left lateral malleolus and lower leg.  There is no pain to the proximal fibula.  There is tenderness over the left lateral malleolus.  There are some mild swelling this area.  There is some mild pain over the fifth metatarsal.  Pedal pulses are intact.  She has normal sensation and motor  function distally.  No open wounds.  Skin:    General: Skin is warm and dry.  Neurological:     Mental Status: She is alert and oriented to person, place, and time.     ED Results / Procedures / Treatments   Labs (all labs ordered are listed, but only abnormal results are displayed) Labs Reviewed - No data to display  EKG None  Radiology DG Ankle Complete Right  Result Date: 05/09/2023 CLINICAL DATA:  Injury, stepping in a hole 7 days ago. Left foot and ankle pain and bruising. EXAM: RIGHT ANKLE - COMPLETE 3+ VIEW; RIGHT FOOT COMPLETE - 3+ VIEW COMPARISON:  None Available. FINDINGS: There is an acute, nondisplaced fracture of the lateral malleolus with overlying soft tissue swelling. The distal tibia peers intact. No acute fracture is identified in the foot. There is no dislocation. Small plantar and posterior calcaneal enthesophytes are noted. There is mild hallux valgus with hypertrophic changes of the first metatarsal head. 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 Degenerative changes are noted at the first and second MTP joints. IMPRESSION: Nondisplaced lateral malleolus fracture. Electronically Signed   By: Sebastian Ache M.D.   On: 05/09/2023 12:45   DG  Foot Complete Right  Result Date: 05/09/2023 CLINICAL DATA:  Injury, stepping in a hole 7 days ago. Left foot and ankle pain and bruising. EXAM: RIGHT ANKLE - COMPLETE 3+ VIEW; RIGHT FOOT COMPLETE - 3+ VIEW COMPARISON:  None Available. FINDINGS: There is an acute, nondisplaced fracture of the lateral malleolus with overlying soft tissue swelling. The distal tibia peers intact. No acute fracture is identified in the foot. There is no dislocation. Small plantar and posterior calcaneal enthesophytes are noted. There is mild hallux valgus with hypertrophic changes of the first metatarsal head. 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 Degenerative  changes are noted at the first and second MTP joints. IMPRESSION: Nondisplaced lateral malleolus fracture. Electronically Signed   By: Sebastian Ache M.D.   On: 05/09/2023 12:45    Procedures Procedures    Medications Ordered in ED Medications - No data to display  ED Course/ Medical Decision Making/ A&P                                 Medical Decision Making Amount and/or Complexity of Data Reviewed Radiology: ordered.   Patient presents with pain and swelling over the left ankle.  X-rays were obtained which were ordered as right but actually x-rays of the left foot and ankle were performed which show evidence of a distal fibula fracture, nondisplaced.  This was interpreted by me and confirmed by the radiologist.  She was placed in a cam walker.  She was advised on ice and elevation.  She was encouraged to follow-up with her orthopedic surgeon for recheck.  Return precautions were given.  She says she has tramadol at home to use for pain.  Final Clinical Impression(s) / ED Diagnoses Final diagnoses:  Closed nondisplaced fracture of lateral malleolus of left fibula, initial encounter    Rx / DC Orders ED Discharge Orders     None         Rolan Bucco, MD 05/09/23 1438

## 2023-05-10 ENCOUNTER — Other Ambulatory Visit (HOSPITAL_BASED_OUTPATIENT_CLINIC_OR_DEPARTMENT_OTHER): Payer: Self-pay | Admitting: Emergency Medicine

## 2023-05-10 DIAGNOSIS — R52 Pain, unspecified: Secondary | ICD-10-CM

## 2023-05-10 NOTE — ED Provider Notes (Signed)
I was notified by a radiation tech that the patient had obtained an x-ray yesterday for an order for the right foot and ankle but a left foot and ankle were obtained which were the ankle in question for injury.  Radiology read also comments right though it was a left foot and ankle image.   Melene Plan, DO 05/10/23 1505

## 2023-05-11 ENCOUNTER — Other Ambulatory Visit (HOSPITAL_BASED_OUTPATIENT_CLINIC_OR_DEPARTMENT_OTHER): Payer: Self-pay | Admitting: Emergency Medicine

## 2023-05-11 ENCOUNTER — Ambulatory Visit (HOSPITAL_BASED_OUTPATIENT_CLINIC_OR_DEPARTMENT_OTHER)
Admission: RE | Admit: 2023-05-11 | Discharge: 2023-05-11 | Disposition: A | Discharge status: Home / Self Care | Payer: PPO | Source: Ambulatory Visit | Attending: Emergency Medicine | Admitting: Emergency Medicine

## 2023-05-11 DIAGNOSIS — R52 Pain, unspecified: Secondary | ICD-10-CM | POA: Insufficient documentation
